# Patient Record
Sex: Female | Born: 1980
Health system: Southern US, Community
[De-identification: ages and names within clinical notes are randomized; demographics above are authoritative.]

## PROBLEM LIST (undated history)

## (undated) DIAGNOSIS — F329 Major depressive disorder, single episode, unspecified: Secondary | ICD-10-CM

## (undated) DIAGNOSIS — N809 Endometriosis, unspecified: Secondary | ICD-10-CM

## (undated) DIAGNOSIS — J189 Pneumonia, unspecified organism: Secondary | ICD-10-CM

## (undated) DIAGNOSIS — J45909 Unspecified asthma, uncomplicated: Secondary | ICD-10-CM

## (undated) DIAGNOSIS — N941 Unspecified dyspareunia: Secondary | ICD-10-CM

## (undated) DIAGNOSIS — R102 Pelvic and perineal pain: Secondary | ICD-10-CM

## (undated) DIAGNOSIS — F32A Depression, unspecified: Secondary | ICD-10-CM

## (undated) DIAGNOSIS — N946 Dysmenorrhea, unspecified: Secondary | ICD-10-CM

## (undated) HISTORY — PX: NO PAST SURGERIES: SHX2092

---

## 2015-10-05 DIAGNOSIS — J189 Pneumonia, unspecified organism: Secondary | ICD-10-CM

## 2015-10-05 HISTORY — DX: Pneumonia, unspecified organism: J18.9

## 2018-06-19 ENCOUNTER — Other Ambulatory Visit: Payer: Self-pay | Admitting: Obstetrics and Gynecology

## 2018-06-21 ENCOUNTER — Encounter (HOSPITAL_BASED_OUTPATIENT_CLINIC_OR_DEPARTMENT_OTHER): Payer: Self-pay | Admitting: *Deleted

## 2018-06-22 ENCOUNTER — Other Ambulatory Visit: Payer: Self-pay

## 2018-06-22 ENCOUNTER — Encounter (HOSPITAL_BASED_OUTPATIENT_CLINIC_OR_DEPARTMENT_OTHER): Payer: Self-pay | Admitting: *Deleted

## 2018-06-22 NOTE — Progress Notes (Addendum)
Spoke w/ pt via phone for pre-op interview.  Pt verbalized understanding npo after mn w/ exception clear liquid diet until 0715 and arrive at 0815.   Needs urine preg.  Pt lives in [place], [place], unable to come and pick up ensure pre-surgery drink.  ADDENDUM:  Received via fax from dr Leo Grosser office a notarized no blood products document, placed with chart.

## 2018-06-26 ENCOUNTER — Encounter (HOSPITAL_BASED_OUTPATIENT_CLINIC_OR_DEPARTMENT_OTHER): Payer: Self-pay | Admitting: Obstetrics and Gynecology

## 2018-06-26 DIAGNOSIS — Z531 Procedure and treatment not carried out because of patient's decision for reasons of belief and group pressure: Secondary | ICD-10-CM

## 2018-06-26 DIAGNOSIS — R102 Pelvic and perineal pain: Secondary | ICD-10-CM | POA: Diagnosis present

## 2018-06-26 DIAGNOSIS — D219 Benign neoplasm of connective and other soft tissue, unspecified: Secondary | ICD-10-CM | POA: Diagnosis present

## 2018-06-26 NOTE — H&P (Signed)
Rebecca Shields is an 37 y.o. female. Who presents for laparoscopic evaluation of pelvic pain, occurring primarily with mense, but also intrermenstrual  and dyspareunia.  Pertinent Gynecological History: Menses: regular every 28 days without intermenstrual spotting, lasting 5 days Bleeding: menses only Contraception: vasectomy DES exposure: unknown Blood transfusions: none .  Refuses blood transfusion on religious grounds Sexually transmitted diseases: no past history Previous GYN Procedures: none  Last mammogram: n/a Date:n/a Last pap: normal Date: 08/2017 OB History: G0, P0   Menstrual History: Menarche age: 26 Patient's last menstrual period was 05/30/2018 (exact date).    Past Medical History:  Diagnosis Date  . Female pelvic pain     Past Surgical History:  Procedure Laterality Date  . NO PAST SURGERIES      History reviewed. No pertinent family history.  Social History:  reports that she has never smoked. She has never used smokeless tobacco. She reports that she drinks alcohol. She reports that she does not use drugs.  Allergies: No Known Allergies  Medications Prior to Admission  Medication Sig Dispense Refill Last Dose  . ibuprofen (ADVIL,MOTRIN) 200 MG tablet Take 200 mg by mouth every 6 (six) hours as needed.   Past Week at Unknown time  . naproxen sodium (ALEVE) 220 MG tablet Take 220 mg by mouth.   Past Month at Unknown time    Review of Systems  Constitutional: Negative.   HENT: Negative.   Respiratory: Negative.   Cardiovascular: Negative.   Gastrointestinal: Positive for constipation.  Genitourinary:       Pelvic pain Dyspareunia  Musculoskeletal: Positive for back pain.       Has known bulging disc  Neurological: Negative.   Endo/Heme/Allergies: Negative.   Psychiatric/Behavioral: The patient is nervous/anxious.     Blood pressure 127/84, pulse 70, temperature 98.6 F (37 C), temperature source Oral, resp. rate 16, height 5\' 1"  (1.549 m),  weight 154 lb 8 oz (70.1 kg), last menstrual period 05/30/2018, SpO2 100 %. Physical Exam  Constitutional: She is oriented to person, place, and time. She appears well-developed and well-nourished.  HENT:  Head: Normocephalic and atraumatic.  Eyes: EOM are normal.  Neck: Normal range of motion. Neck supple.  Cardiovascular: Normal rate and regular rhythm.  Respiratory: Effort normal and breath sounds normal.  GI: Soft. Bowel sounds are normal. She exhibits mass.  Suprapubic nontender  Genitourinary:  Genitourinary Comments: Pelvic exam:  VULVA: normal appearing vulva with no masses, tenderness or lesions,  VAGINA: normal appearing vagina with normal color and discharge, no lesions,  CERVIX: normal appearing cervix without discharge or lesions,  UTERUS: uterus is normal size, shape, consistency and nontender, enlarged to 12 week's size,  ADNEXA: normal adnexa in size, nontender and no masses, no masses.  Musculoskeletal: Normal range of motion.  Neurological: She is alert and oriented to person, place, and time.  Skin: Skin is warm and dry.  Psychiatric: She has a normal mood and affect.  anxious    Results for orders placed or performed during the hospital encounter of 06/27/18 (from the past 24 hour(s))  Pregnancy, urine POC     Status: None   Collection Time: 06/27/18  8:38 AM  Result Value Ref Range   Preg Test, Ur NEGATIVE NEGATIVE    Assessment/Plan: Pelvic pain both with menses and between menses Dyspareunia, insertional and deep Fibroids  Plan: Pt was given the options of conservative management with continuous bcps, Mirena IUD or Laparoscopy for more evaluation with resection of endometriosis if possible.Marland Kitchen  Pt chooses the latter.  Reviewed the risks of anesthesia, bleeding, infection and damage to adjacent organs associated with laparoscopy.  She wishes to proceed.  Rebecca Shields 06/27/2018, 9:14 AM

## 2018-06-27 ENCOUNTER — Ambulatory Visit (HOSPITAL_BASED_OUTPATIENT_CLINIC_OR_DEPARTMENT_OTHER): Payer: BLUE CROSS/BLUE SHIELD | Admitting: Anesthesiology

## 2018-06-27 ENCOUNTER — Encounter (HOSPITAL_BASED_OUTPATIENT_CLINIC_OR_DEPARTMENT_OTHER)
Admission: RE | Disposition: A | Discharge status: Home / Self Care | Payer: Self-pay | Source: Ambulatory Visit | Attending: Obstetrics and Gynecology

## 2018-06-27 ENCOUNTER — Encounter (HOSPITAL_BASED_OUTPATIENT_CLINIC_OR_DEPARTMENT_OTHER): Payer: Self-pay | Admitting: *Deleted

## 2018-06-27 ENCOUNTER — Ambulatory Visit (HOSPITAL_BASED_OUTPATIENT_CLINIC_OR_DEPARTMENT_OTHER)
Admission: RE | Admit: 2018-06-27 | Discharge: 2018-06-27 | Disposition: A | Discharge status: Home / Self Care | Payer: BLUE CROSS/BLUE SHIELD | Source: Ambulatory Visit | Attending: Obstetrics and Gynecology | Admitting: Obstetrics and Gynecology

## 2018-06-27 DIAGNOSIS — N83201 Unspecified ovarian cyst, right side: Secondary | ICD-10-CM | POA: Diagnosis not present

## 2018-06-27 DIAGNOSIS — R102 Pelvic and perineal pain unspecified side: Secondary | ICD-10-CM | POA: Diagnosis present

## 2018-06-27 DIAGNOSIS — Z531 Procedure and treatment not carried out because of patient's decision for reasons of belief and group pressure: Secondary | ICD-10-CM

## 2018-06-27 DIAGNOSIS — N736 Female pelvic peritoneal adhesions (postinfective): Secondary | ICD-10-CM | POA: Diagnosis not present

## 2018-06-27 DIAGNOSIS — N941 Unspecified dyspareunia: Secondary | ICD-10-CM | POA: Insufficient documentation

## 2018-06-27 DIAGNOSIS — N946 Dysmenorrhea, unspecified: Secondary | ICD-10-CM | POA: Insufficient documentation

## 2018-06-27 DIAGNOSIS — D219 Benign neoplasm of connective and other soft tissue, unspecified: Secondary | ICD-10-CM | POA: Diagnosis present

## 2018-06-27 HISTORY — PX: LAPAROSCOPIC LYSIS OF ADHESIONS: SHX5905

## 2018-06-27 HISTORY — DX: Pelvic and perineal pain: R10.2

## 2018-06-27 LAB — POCT PREGNANCY, URINE: PREG TEST UR: NEGATIVE

## 2018-06-27 SURGERY — LYSIS, ADHESIONS, LAPAROSCOPIC
Anesthesia: General

## 2018-06-27 MED ORDER — DEXAMETHASONE SODIUM PHOSPHATE 10 MG/ML IJ SOLN
INTRAMUSCULAR | Status: AC
  Filled 2018-06-27: qty 1

## 2018-06-27 MED ORDER — ONDANSETRON HCL 4 MG/2ML IJ SOLN
INTRAMUSCULAR | Status: AC
  Filled 2018-06-27: qty 2

## 2018-06-27 MED ORDER — KETOROLAC TROMETHAMINE 15 MG/ML IJ SOLN
15.0000 mg | INTRAMUSCULAR | Status: AC
  Administered 2018-06-27 (×2): 15 mg via INTRAVENOUS
  Filled 2018-06-27: qty 1

## 2018-06-27 MED ORDER — ROCURONIUM BROMIDE 10 MG/ML (PF) SYRINGE
PREFILLED_SYRINGE | INTRAVENOUS | Status: DC | PRN
  Administered 2018-06-27 (×2): 10 mg via INTRAVENOUS
  Administered 2018-06-27: 50 mg via INTRAVENOUS

## 2018-06-27 MED ORDER — MEPERIDINE HCL 25 MG/ML IJ SOLN
6.2500 mg | INTRAMUSCULAR | Status: DC | PRN
  Filled 2018-06-27: qty 1

## 2018-06-27 MED ORDER — OXYCODONE HCL 5 MG PO TABS
5.0000 mg | ORAL_TABLET | Freq: Once | ORAL | Status: DC | PRN
  Filled 2018-06-27: qty 1

## 2018-06-27 MED ORDER — ONDANSETRON 8 MG PO TBDP
ORAL_TABLET | ORAL | Status: AC
  Filled 2018-06-27: qty 1

## 2018-06-27 MED ORDER — MIDAZOLAM HCL 2 MG/2ML IJ SOLN
INTRAMUSCULAR | Status: DC | PRN
  Administered 2018-06-27: 2 mg via INTRAVENOUS

## 2018-06-27 MED ORDER — LIDOCAINE 2% (20 MG/ML) 5 ML SYRINGE
INTRAMUSCULAR | Status: AC
  Filled 2018-06-27: qty 5

## 2018-06-27 MED ORDER — BUPIVACAINE LIPOSOME 1.3 % IJ SUSP
INTRAMUSCULAR | Status: DC | PRN
  Administered 2018-06-27: 20 mL

## 2018-06-27 MED ORDER — KETOROLAC TROMETHAMINE 30 MG/ML IJ SOLN
INTRAMUSCULAR | Status: DC | PRN
  Administered 2018-06-27: 30 mg via INTRAMUSCULAR

## 2018-06-27 MED ORDER — ONDANSETRON HCL 4 MG/2ML IJ SOLN
INTRAMUSCULAR | Status: DC | PRN
  Administered 2018-06-27: 4 mg via INTRAVENOUS

## 2018-06-27 MED ORDER — SCOPOLAMINE 1 MG/3DAYS TD PT72
1.0000 | MEDICATED_PATCH | TRANSDERMAL | Status: DC
  Administered 2018-06-27: 1.5 mg via TRANSDERMAL
  Filled 2018-06-27: qty 1

## 2018-06-27 MED ORDER — KETOROLAC TROMETHAMINE 30 MG/ML IJ SOLN
INTRAMUSCULAR | Status: AC
  Filled 2018-06-27: qty 1

## 2018-06-27 MED ORDER — PROMETHAZINE HCL 25 MG/ML IJ SOLN
6.2500 mg | INTRAMUSCULAR | Status: DC | PRN
  Filled 2018-06-27: qty 1

## 2018-06-27 MED ORDER — FENTANYL CITRATE (PF) 250 MCG/5ML IJ SOLN
INTRAMUSCULAR | Status: AC
  Filled 2018-06-27: qty 5

## 2018-06-27 MED ORDER — MIDAZOLAM HCL 2 MG/2ML IJ SOLN
INTRAMUSCULAR | Status: AC
  Filled 2018-06-27: qty 2

## 2018-06-27 MED ORDER — LACTATED RINGERS IV SOLN
INTRAVENOUS | Status: DC
  Administered 2018-06-27 (×2): via INTRAVENOUS
  Filled 2018-06-27: qty 1000

## 2018-06-27 MED ORDER — PROPOFOL 10 MG/ML IV BOLUS
INTRAVENOUS | Status: AC
  Filled 2018-06-27: qty 20

## 2018-06-27 MED ORDER — HYDROMORPHONE HCL 1 MG/ML IJ SOLN
INTRAMUSCULAR | Status: AC
  Filled 2018-06-27: qty 1

## 2018-06-27 MED ORDER — LIDOCAINE 2% (20 MG/ML) 5 ML SYRINGE
INTRAMUSCULAR | Status: DC | PRN
  Administered 2018-06-27: 40 mg via INTRAVENOUS

## 2018-06-27 MED ORDER — HYDROCODONE-ACETAMINOPHEN 5-325 MG PO TABS: 1.0000 | ORAL_TABLET | Freq: Four times a day (QID) | ORAL | 0 refills | Status: DC | PRN

## 2018-06-27 MED ORDER — ACETAMINOPHEN 500 MG PO TABS
1000.0000 mg | ORAL_TABLET | ORAL | Status: AC
  Administered 2018-06-27: 1000 mg via ORAL
  Filled 2018-06-27: qty 2

## 2018-06-27 MED ORDER — IBUPROFEN 600 MG PO TABS: ORAL_TABLET | ORAL | 0 refills | Status: DC

## 2018-06-27 MED ORDER — LACTATED RINGERS IV SOLN
INTRAVENOUS | Status: DC
  Filled 2018-06-27: qty 1000

## 2018-06-27 MED ORDER — KETOROLAC TROMETHAMINE 30 MG/ML IJ SOLN
INTRAMUSCULAR | Status: AC
  Filled 2018-06-27: qty 3

## 2018-06-27 MED ORDER — PROPOFOL 10 MG/ML IV BOLUS
INTRAVENOUS | Status: DC | PRN
  Administered 2018-06-27: 70 mg via INTRAVENOUS
  Administered 2018-06-27: 130 mg via INTRAVENOUS

## 2018-06-27 MED ORDER — SCOPOLAMINE 1 MG/3DAYS TD PT72
MEDICATED_PATCH | TRANSDERMAL | Status: AC
  Filled 2018-06-27: qty 1

## 2018-06-27 MED ORDER — SUGAMMADEX SODIUM 200 MG/2ML IV SOLN
INTRAVENOUS | Status: AC
  Filled 2018-06-27: qty 2

## 2018-06-27 MED ORDER — FENTANYL CITRATE (PF) 100 MCG/2ML IJ SOLN
INTRAMUSCULAR | Status: DC | PRN
  Administered 2018-06-27 (×3): 50 ug via INTRAVENOUS
  Administered 2018-06-27: 100 ug via INTRAVENOUS

## 2018-06-27 MED ORDER — ONDANSETRON 8 MG PO TBDP
8.0000 mg | ORAL_TABLET | Freq: Once | ORAL | Status: AC
  Administered 2018-06-27: 8 mg via ORAL
  Filled 2018-06-27: qty 1

## 2018-06-27 MED ORDER — HYDROMORPHONE HCL 1 MG/ML IJ SOLN
0.2500 mg | INTRAMUSCULAR | Status: DC | PRN
  Administered 2018-06-27: 0.5 mg via INTRAVENOUS
  Filled 2018-06-27: qty 0.5

## 2018-06-27 MED ORDER — BUPIVACAINE HCL (PF) 0.5 % IJ SOLN
INTRAMUSCULAR | Status: DC | PRN
  Administered 2018-06-27: 30 mL

## 2018-06-27 MED ORDER — DEXAMETHASONE SODIUM PHOSPHATE 10 MG/ML IJ SOLN
INTRAMUSCULAR | Status: DC | PRN
  Administered 2018-06-27: 10 mg via INTRAVENOUS

## 2018-06-27 MED ORDER — OXYCODONE HCL 5 MG/5ML PO SOLN
5.0000 mg | Freq: Once | ORAL | Status: DC | PRN
  Filled 2018-06-27: qty 5

## 2018-06-27 MED ORDER — ACETAMINOPHEN 500 MG PO TABS
ORAL_TABLET | ORAL | Status: AC
  Filled 2018-06-27: qty 2

## 2018-06-27 MED ORDER — SUGAMMADEX SODIUM 200 MG/2ML IV SOLN
INTRAVENOUS | Status: DC | PRN
  Administered 2018-06-27: 200 mg via INTRAVENOUS

## 2018-06-27 MED ORDER — ROCURONIUM BROMIDE 10 MG/ML (PF) SYRINGE
PREFILLED_SYRINGE | INTRAVENOUS | Status: AC
  Filled 2018-06-27: qty 10

## 2018-06-27 MED FILL — IBUPROFEN 600 MG TABLET: 600 | 15 days supply | Qty: 60 | Fill #0

## 2018-06-27 MED FILL — HYDROCODON-APAP 5-325: 5-325 | 2 days supply | Qty: 20 | Fill #0

## 2018-06-27 SURGICAL SUPPLY — 51 items
BAG RETRIEVAL 10MM (BASKET)
BAG URINE DRAINAGE (UROLOGICAL SUPPLIES) ×3 IMPLANT
CABLE HIGH FREQUENCY MONO STRZ (ELECTRODE) ×3 IMPLANT
CATH ROBINSON RED A/P 16FR (CATHETERS) IMPLANT
CLOSURE WOUND 1/4 X3 (GAUZE/BANDAGES/DRESSINGS)
DERMABOND ADVANCED (GAUZE/BANDAGES/DRESSINGS) ×2
DERMABOND ADVANCED .7 DNX12 (GAUZE/BANDAGES/DRESSINGS) ×1 IMPLANT
DRSG OPSITE POSTOP 3X4 (GAUZE/BANDAGES/DRESSINGS) ×3 IMPLANT
DRSG TELFA 3X8 NADH (GAUZE/BANDAGES/DRESSINGS) IMPLANT
DURAPREP 26ML APPLICATOR (WOUND CARE) ×6 IMPLANT
GAUZE SPONGE 4X4 16PLY XRAY LF (GAUZE/BANDAGES/DRESSINGS) ×3 IMPLANT
GLOVE BIOGEL PI IND STRL 7.0 (GLOVE) ×3 IMPLANT
GLOVE BIOGEL PI INDICATOR 7.0 (GLOVE) ×6
GLOVE SURG SS PI 6.5 STRL IVOR (GLOVE) ×6 IMPLANT
GOWN STRL REUS W/TWL LRG LVL3 (GOWN DISPOSABLE) ×3 IMPLANT
KIT TURNOVER CYSTO (KITS) ×3 IMPLANT
NEEDLE HYPO 25X1 1.5 SAFETY (NEEDLE) ×6 IMPLANT
NEEDLE INSUFFLATION 120MM (ENDOMECHANICALS) ×3 IMPLANT
NEEDLE INSUFFLATION 14GA 150MM (NEEDLE) IMPLANT
NS IRRIG 1000ML POUR BTL (IV SOLUTION) ×3 IMPLANT
PACK LAPAROSCOPY BASIN (CUSTOM PROCEDURE TRAY) ×3 IMPLANT
PACK TRENDGUARD 450 HYBRID PRO (MISCELLANEOUS) ×1 IMPLANT
PAD POSITIONING PINK XL (MISCELLANEOUS) IMPLANT
PAD PREP 24X48 CUFFED NSTRL (MISCELLANEOUS) ×3 IMPLANT
PROTECTOR NERVE ULNAR (MISCELLANEOUS) ×3 IMPLANT
SET IRRIG TUBING LAPAROSCOPIC (IRRIGATION / IRRIGATOR) IMPLANT
SET TRI-LUMEN FLTR TB AIRSEAL (TUBING) IMPLANT
SHEARS HARMONIC ACE PLUS 36CM (ENDOMECHANICALS) IMPLANT
SHEARS HARMONIC ACE PLUS 45CM (MISCELLANEOUS) IMPLANT
STRIP CLOSURE SKIN 1/4X3 (GAUZE/BANDAGES/DRESSINGS) IMPLANT
SUT MNCRL AB 3-0 PS2 27 (SUTURE) ×3 IMPLANT
SUT MNCRL AB 4-0 PS2 18 (SUTURE) IMPLANT
SUT VIC AB 0 UR5 27 (SUTURE) IMPLANT
SUT VIC AB 3-0 PS2 18 (SUTURE)
SUT VIC AB 3-0 PS2 18XBRD (SUTURE) IMPLANT
SUT VICRYL 0 ENDOLOOP (SUTURE) IMPLANT
SUT VICRYL 0 UR6 27IN ABS (SUTURE) ×3 IMPLANT
SYR 10ML LL (SYRINGE) ×3 IMPLANT
SYR 50ML LL SCALE MARK (SYRINGE) ×3 IMPLANT
SYS BAG RETRIEVAL 10MM (BASKET)
SYSTEM BAG RETRIEVAL 10MM (BASKET) IMPLANT
TOWEL OR 17X24 6PK STRL BLUE (TOWEL DISPOSABLE) ×6 IMPLANT
TRAY FOLEY W/BAG SLVR 14FR (SET/KITS/TRAYS/PACK) ×3 IMPLANT
TRENDGUARD 450 HYBRID PRO PACK (MISCELLANEOUS) ×3
TROCAR BALLN 12MMX100 BLUNT (TROCAR) ×3 IMPLANT
TROCAR OPTI TIP 5M 100M (ENDOMECHANICALS) ×3 IMPLANT
TROCAR PORT AIRSEAL 5X120 (TROCAR) IMPLANT
TROCAR XCEL DIL TIP R 11M (ENDOMECHANICALS) ×3 IMPLANT
TUBING INSUF HEATED (TUBING) ×3 IMPLANT
WARMER LAPAROSCOPE (MISCELLANEOUS) ×3 IMPLANT
WATER STERILE IRR 500ML POUR (IV SOLUTION) IMPLANT

## 2018-06-27 NOTE — Discharge Instructions (Signed)
DISCHARGE INSTRUCTIONS TO PATIENT  Medications and dosages: see AVS  REMINDER:   Carry a list of your medications and allergies with you at all times  Call your pharmacy at least 1 week in advance to refill prescriptions  Do not mix any prescribed pain medicine with alcohol  Do not drive any motor vehicles while taking pain medication.  Take medications with food.  Do not retake a pain medication if you vomit after taking it.  Activity: as tolerated.  No driving for 24 hours   Follow-up appointments (date to return to physician):   Call Surgeon if you have:  Temperature greater than 100.4  Persistent nausea and vomiting  Severe uncontrolled pain  Redness, tenderness, or signs of infection (pain, swelling, redness, odor or green/yellow discharge around the site)  Difficulty breathing, headache or visual disturbances  Hives  Persistent dizziness or light-headedness  Extreme fatigue  Any other questions or concerns you may have after discharge  In an emergency, call 911 or go to an Emergency Department at a nearby hospital     Diet:   Begin with liquids, and if they are tolerated, resume your usual diet.  Avoid spicy, greasy or heavy foods.  If you have nausea or vomiting, go back to liquids.  If you cannot keep liquids down, call your doctor.  Avoid alcohol consumption while on prescription pain medications. Good nutrition promotes healing. Increase fiber and fluids.     Diagnostic Laparoscopy, Care After Refer to this sheet in the next few weeks. These instructions provide you with information about caring for yourself after your procedure. Your health care provider may also give you more specific instructions. Your treatment has been planned according to current medical practices, but problems sometimes occur. Call your health care provider if you have any problems or questions after your procedure. What can I expect after the procedure? After your procedure,  it is common to have mild discomfort in the throat and abdomen. Follow these instructions at home:  Take over-the-counter and prescription medicines only as told by your health care provider.  Do not drive for 24 hours if you received a sedative.  Return to your normal activities as told by your health care provider.  Do not take baths, swim, or use a hot tub until your health care provider approves. You may shower.  Follow instructions from your health care provider about how to take care of your incision. Make sure you: ? Wash your hands with soap and water before you change your bandage (dressing). If soap and water are not available, use hand sanitizer. ? Change your dressing as told by your health care provider. ? Leave stitches (sutures), skin glue, or adhesive strips in place. These skin closures may need to stay in place for 2 weeks or longer. If adhesive strip edges start to loosen and curl up, you may trim the loose edges. Do not remove adhesive strips completely unless your health care provider tells you to do that.  Check your incision area every day for signs of infection. Check for: ? More redness, swelling, or pain. ? More fluid or blood. ? Warmth. ? Pus or a bad smell.  It is your responsibility to get the results of your procedure. Ask your health care provider or the department performing the procedure when your results will be ready. Contact a health care provider if:  There is new pain in your shoulders.  You feel light-headed or faint.  You are unable to pass gas  or unable to have a bowel movement.  You feel nauseous or you vomit.  You develop a rash.  You have more redness, swelling, or pain around your incision.  You have more fluid or blood coming from your incision.  Your incision feels warm to the touch.  You have pus or a bad smell coming from your incision.  You have a fever or chills. Get help right away if:  Your pain is getting  worse.  You have ongoing vomiting.  The edges of your incision open up.  You have trouble breathing.  You have chest pain. This information is not intended to replace advice given to you by your health care provider. Make sure you discuss any questions you have with your health care provider. Document Released: 09/01/2015 Document Revised: 02/26/2016 Document Reviewed: 06/03/2015 Elsevier Interactive Patient Education  [employer].  I understand and acknowledge receipt of the above instructions.                                                                                                                                        Patient or Guardian Signature                                                                    Date/Time                                                                                                                                              Post Anesthesia Home Care Instructions  Activity: Get plenty of rest for the remainder of the day. A responsible individual must stay with you for 24 hours following the procedure.  For the next 24 hours, DO NOT: -Drive a car -Paediatric nurse -Drink alcoholic beverages -Take any medication unless instructed by your physician -Make any legal decisions or sign important papers.  Meals: Start with liquid foods such as gelatin or soup. Progress to regular foods as tolerated. Avoid greasy, spicy, heavy foods. If nausea and/or vomiting occur, drink only clear liquids  until the nausea and/or vomiting subsides. Call your physician if vomiting continues.  Special Instructions/Symptoms: Your throat may feel dry or sore from the anesthesia or the breathing tube placed in your throat during surgery. If this causes discomfort, gargle with warm salt water. The discomfort should disappear within 24 hours.  If you had a scopolamine patch placed behind your ear for the management of post- operative nausea and/or  vomiting:  1. The medication in the patch is effective for 72 hours, after which it should be removed.  Wrap patch in a tissue and discard in the trash. Wash hands thoroughly with soap and water. 2. You may remove the patch earlier than 72 hours if you experience unpleasant side effects which may include dry mouth, dizziness or visual disturbances. 3. Avoid touching the patch. Wash your hands with soap and water after contact with the patch.    Physician's or R.N.'s Signature                                                                  Date/Time  The discharge instructions have been reviewed with the patient and/or Family Member/Parent/Guardian.  Patient and/or Family Member/Parent/Guardian signed and retained a printed copy.    NO IBUPROFEN PRODUCTS (MOTRIN,ADVIL) OR ALEVE UNTIL 5:25PM TODAY.

## 2018-06-27 NOTE — Op Note (Signed)
Operative Laparoscopy Procedure Note  Indications: The patient is a 37 y.o. female with pelvic pain dysmenorrhea and dyspareunia.  Pre-operative Diagnosis: Pelvic pain dysmenorrhea and dyspareunia  Post-operative Diagnosis: Pelvic pain dysmenorrhea and dyspareunia.  Endometriosis and pelvic adhesions  Surgeon: Eldred Manges   Assistants: None  Anesthesia: General endotracheal anesthesia  ASA Class: 2  Procedure Details  The patient was seen in the Holding Room. The risks, benefits, complications, treatment options, and expected outcomes were discussed with the patient.  Her desire to receive no blood products and no intravenous infusions other than those indicated on her completed alternatives to blood transfusion sheet.  The patient concurred with the proposed plan, giving informed consent. The patient was taken to the Operating Room, identified as Rebecca Shields and the procedure verified as operative laparoscopy. A Time Out was held and the above information confirmed.  After induction of general anesthesia, the patient was placed in modified dorsal lithotomy position where she was prepped, draped,in the normal, sterile fashion.  An indwelling Foley catheter was placed under sterile conditions and connected to straight drainage.  The cervix was visualized and an intrauterine manipulator was placed. Subumbilical and suprapubic injection of half percent Marcaine was undertaken for a total of 6 cc.  A 10 mm umbilical incision was then performed.  The fascia was exposed with combination of blunt and sharp dissection and elevated then incised.  Peritoneum was bluntly entered.  The peritoneum and fascia were marked on either side of the incision and the Hassan cannula was passed into the peritoneum under direct visualization, stabilized with the marking sutures of 0 Vicryl, and pneumoperitoneum was established. The above findings were noted.  Suprapubic incisions were made to the right and  left of midline, a 5 mm incision on the left and a 10 mm incision on the right.  Arthroscopic probe trochars of each size were placed into the peritoneal cavity under direct visualization.  The ovaries that were bilaterally adherent to the posterior uterus at the level of the cervix were mobilized with a combination of blunt and sharp dissection.  The right ovary contained a cyst which ruptured on manipulation and was subsequently excised sharply with the cut edges made hemostatic with bipolar cautery.  The densely adherent sigmoid colon was not mobilized because of concern for damage to the active visceral organ.  Copious irrigation was carried out and hemostasis noted to be adequate.  20 cc of Exparel and 10 cc of half percent Marcaine mixture were placed in the posterior pelvis at the site of the dissection.  Following the procedure the suprapubic scars were removed under direct visualization.  The Hassan cannula in the umbilical sheath was removed and the intra-abdominal carbon dioxide was expressed.  The fascial incision was closed with figure-of-eight sutures of 0 Vicryl.  The skin the incision was closed with subcuticular sutures of 3-0 Monocryl.  The right suprapubic incision was closed with actual sutures of 0 Vicryl and Dermabond.  The left suprapubic incision was closed with Dermabond.  Sterile dressings were applied to the incisions of the 10 mm or greater.  The intrauterine manipulator was then removed.  Instrument, sponge, and needle counts were correct prior to abdominal closure and at the conclusion of the case.   Findings: The anterior cul-de-sac and round ligaments no adhesions or stigmata of endometriosis. The uterus minimally enlarged without irregularity.  The posterior uterus was densely adherent to the sigmoid colon in the midline and the right and left ovaries posteriorly on  either side. The adnexa the tubes were normal-appearing with fairly fine fimbria.  The ovaries bilaterally  were adherent to the posterior uterus with a cyst on the right ovary.  The initial release of the right ovary a like material was released.  Other than the adhesive disease no other stigmata of endometriosis was noted. Cul-de-sac posteriorly the was involved with the sigmoid to posterior uterine adhesions at the level of the uterosacral ligament. Appendix: Visualized in the right abdomen without significant adhesions and freely mobile.  There were no stigmata of endometriosis.  Estimated Blood Loss:  less than 50 mL         Drains: None         Specimens: Ovarian cyst rule out endometriosis              Complications:  None; patient tolerated the procedure well.         Disposition: PACU - hemodynamically stable.         Condition: stable

## 2018-06-27 NOTE — Transfer of Care (Signed)
Immediate Anesthesia Transfer of Care Note  Patient: Rebecca Shields  Procedure(s) Performed: LAPAROSCOPIC LYSIS OF ADHESIONS,RIGHT OVARIAN CYSTECTOMY (N/A )  Patient Location: PACU  Anesthesia Type:General  Level of Consciousness: awake, alert , oriented and patient cooperative  Airway & Oxygen Therapy: Patient Spontanous Breathing and Patient connected to face mask oxygen  Post-op Assessment: Report given to RN and Post -op Vital signs reviewed and stable  Post vital signs: Reviewed and stable  Last Vitals:  Vitals Value Taken Time  BP    Temp    Pulse    Resp    SpO2      Last Pain:  Vitals:   06/27/18 0817  TempSrc: Oral         Complications: No apparent anesthesia complications

## 2018-06-27 NOTE — Anesthesia Preprocedure Evaluation (Addendum)
Anesthesia Evaluation  Patient identified by MRN, date of birth, ID band Patient awake    Reviewed: Allergy & Precautions, NPO status , Patient's Chart, lab work & pertinent test results  Airway Mallampati: I  TM Distance: >3 FB Neck ROM: Full    Dental  (+) Teeth Intact, Dental Advisory Given   Pulmonary neg pulmonary ROS,    breath sounds clear to auscultation       Cardiovascular negative cardio ROS   Rhythm:Regular Rate:Normal     Neuro/Psych negative neurological ROS     GI/Hepatic negative GI ROS, Neg liver ROS,   Endo/Other  negative endocrine ROS  Renal/GU negative Renal ROS     Musculoskeletal negative musculoskeletal ROS (+)   Abdominal Normal abdominal exam  (+)   Peds  Hematology negative hematology ROS (+)   Anesthesia Other Findings   Reproductive/Obstetrics                           Anesthesia Physical Anesthesia Plan  ASA: I  Anesthesia Plan: General   Post-op Pain Management:    Induction: Intravenous  PONV Risk Score and Plan: 4 or greater and Ondansetron, Dexamethasone, Midazolam and Scopolamine patch - Pre-op  Airway Management Planned: Oral ETT  Additional Equipment: None  Intra-op Plan:   Post-operative Plan: Extubation in OR  Informed Consent: I have reviewed the patients History and Physical, chart, labs and discussed the procedure including the risks, benefits and alternatives for the proposed anesthesia with the patient or authorized representative who has indicated his/her understanding and acceptance.   Dental advisory given  Plan Discussed with: CRNA  Anesthesia Plan Comments:        Anesthesia Quick Evaluation

## 2018-06-27 NOTE — Anesthesia Procedure Notes (Addendum)
Procedure Name: Intubation Date/Time: 06/27/2018 10:14 AM Performed by: Wanita Chamberlain, CRNA Pre-anesthesia Checklist: Patient being monitored, Suction available, Emergency Drugs available, Patient identified and Timeout performed Patient Re-evaluated:Patient Re-evaluated prior to induction Oxygen Delivery Method: Circle system utilized Preoxygenation: Pre-oxygenation with 100% oxygen Induction Type: IV induction Ventilation: Mask ventilation without difficulty Laryngoscope Size: Mac and 3 Grade View: Grade II Tube type: Oral Tube size: 7.0 mm Number of attempts: 1 Airway Equipment and Method: Stylet Placement Confirmation: CO2 detector,  positive ETCO2,  ETT inserted through vocal cords under direct vision and breath sounds checked- equal and bilateral Secured at: 21 cm Tube secured with: Tape Dental Injury: Teeth and Oropharynx as per pre-operative assessment  Comments: Grade II view due to trenguard head positioner limiting neck extension

## 2018-06-28 ENCOUNTER — Encounter (HOSPITAL_BASED_OUTPATIENT_CLINIC_OR_DEPARTMENT_OTHER): Payer: Self-pay | Admitting: Obstetrics and Gynecology

## 2018-06-28 NOTE — Anesthesia Postprocedure Evaluation (Signed)
Anesthesia Post Note  Patient: Carlen A Romberger  Procedure(s) Performed: LAPAROSCOPIC LYSIS OF ADHESIONS,RIGHT OVARIAN CYSTECTOMY (N/A )     Patient location during evaluation: PACU Anesthesia Type: General Level of consciousness: awake and alert Pain management: pain level controlled Vital Signs Assessment: post-procedure vital signs reviewed and stable Respiratory status: spontaneous breathing, nonlabored ventilation, respiratory function stable and patient connected to nasal cannula oxygen Cardiovascular status: blood pressure returned to baseline and stable Postop Assessment: no apparent nausea or vomiting Anesthetic complications: no    Last Vitals:  Vitals:   06/27/18 1400 06/27/18 1526  BP: 124/84 104/73  Pulse: 66 79  Resp: 16 17  Temp: 36.6 C 36.4 C  SpO2: 99% 96%    Last Pain:  Vitals:   06/27/18 1526  TempSrc: Oral  PainSc:                  Effie Berkshire

## 2018-09-20 ENCOUNTER — Other Ambulatory Visit: Payer: Self-pay | Admitting: Obstetrics and Gynecology

## 2018-09-20 ENCOUNTER — Encounter (HOSPITAL_COMMUNITY): Payer: Self-pay

## 2018-09-20 NOTE — Patient Instructions (Addendum)
Your procedure is scheduled on: Monday, Dec. 23, 2019   Surgery Time:  7:15AM-10:15AM   Report to [place]  Entrance    Report to admitting at 5:30 AM   Call this number if you have problems the morning of surgery [phone]   Do not eat food:After Midnight.   May have liquids until 4:30AM day of surgery CLEAR LIQUID DIET  Foods Allowed                                                                     Foods Excluded  Water, Black Coffee and tea, regular and decaf                             liquids that you cannot  Plain Jell-O in any flavor                                             see through such as: Fruit ices (not with fruit pulp)                                     milk, soups, orange juice  Iced Popsicles                                    All solid food Carbonated beverages, regular and diet                                    Cranberry, grape and apple juices Sports drinks like Gatorade Lightly seasoned clear broth or consume(fat free) Sugar, honey syrup  Sample Menu Breakfast                                Lunch                                     Supper Cranberry juice                    Beef broth                            Chicken broth Jell-O                                     Grape juice                           Apple juice Coffee or tea  Jell-O                                      Popsicle                                                Coffee or tea                        Coffee or tea    Brush your teeth the morning of surgery.   Do NOT smoke after Midnight   Complete one Ensure drink the morning of surgery at 4:30AM the day of surgery.   Take these medicines the morning of surgery with A SIP OF WATER:  None                               You may not have any metal on your body including hair pins, jewelry, and body piercings             Do not wear make-up, lotions, powders, perfumes/cologne, or  deodorant             Do not wear nail polish.  Do not shave  48 hours prior to surgery.              Do not bring valuables to the hospital. [place].   Contacts, dentures or bridgework may not be worn into surgery.   Leave suitcase in the car. After surgery it may be brought to your room.    Special Instructions: Bring a copy of your healthcare power of attorney and living will documents         the day of surgery if you haven't scanned them in before.              Please read over the following fact sheets you were given:  [facility] - Preparing for Surgery Before surgery, you can play an important role.  Because skin is not sterile, your skin needs to be as free of germs as possible.  You can reduce the number of germs on your skin by washing with CHG (chlorahexidine gluconate) soap before surgery.  CHG is an antiseptic cleaner which kills germs and bonds with the skin to continue killing germs even after washing. Please DO NOT use if you have an allergy to CHG or antibacterial soaps.  If your skin becomes reddened/irritated stop using the CHG and inform your nurse when you arrive at Short Stay. Do not shave (including legs and underarms) for at least 48 hours prior to the first CHG shower.  You may shave your face/neck.  Please follow these instructions carefully:  1.  Shower with CHG Soap the night before surgery and the  morning of surgery.  2.  If you choose to wash your hair, wash your hair first as usual with your normal  shampoo.  3.  After you shampoo, rinse your hair and body thoroughly to remove the shampoo.  4.  Use CHG as you would any other liquid soap.  You can apply chg directly to the skin and wash.  Gently with a scrungie or clean washcloth.  5.  Apply the CHG Soap to your body ONLY FROM THE NECK DOWN.   Do   not use on face/ open                           Wound or open sores. Avoid contact  with eyes, ears mouth and   genitals (private parts).                       Wash face,  Genitals (private parts) with your normal soap.             6.  Wash thoroughly, paying special attention to the area where your    surgery  will be performed.  7.  Thoroughly rinse your body with warm water from the neck down.  8.  DO NOT shower/wash with your normal soap after using and rinsing off the CHG Soap.                9.  Pat yourself dry with a clean towel.            10.  Wear clean pajamas.            11.  Place clean sheets on your bed the night of your first shower and do not  sleep with pets. Day of Surgery : Do not apply any lotions/deodorants the morning of surgery.  Please wear clean clothes to the hospital/surgery center.  FAILURE TO FOLLOW THESE INSTRUCTIONS MAY RESULT IN THE CANCELLATION OF YOUR SURGERY  PATIENT SIGNATURE_________________________________  NURSE SIGNATURE__________________________________  ________________________________________________________________________

## 2018-09-22 ENCOUNTER — Encounter (HOSPITAL_COMMUNITY): Payer: Self-pay

## 2018-09-22 ENCOUNTER — Other Ambulatory Visit: Payer: Self-pay

## 2018-09-22 ENCOUNTER — Encounter (HOSPITAL_COMMUNITY)
Admission: RE | Admit: 2018-09-22 | Discharge: 2018-09-22 | Disposition: A | Discharge status: Home / Self Care | Payer: BLUE CROSS/BLUE SHIELD | Source: Ambulatory Visit | Attending: Obstetrics and Gynecology | Admitting: Obstetrics and Gynecology

## 2018-09-22 DIAGNOSIS — Z01812 Encounter for preprocedural laboratory examination: Secondary | ICD-10-CM | POA: Insufficient documentation

## 2018-09-22 HISTORY — DX: Pneumonia, unspecified organism: J18.9

## 2018-09-22 HISTORY — DX: Major depressive disorder, single episode, unspecified: F32.9

## 2018-09-22 HISTORY — DX: Depression, unspecified: F32.A

## 2018-09-22 HISTORY — DX: Dysmenorrhea, unspecified: N94.6

## 2018-09-22 HISTORY — DX: Unspecified dyspareunia: N94.10

## 2018-09-22 HISTORY — DX: Endometriosis, unspecified: N80.9

## 2018-09-22 HISTORY — DX: Unspecified asthma, uncomplicated: J45.909

## 2018-09-22 LAB — CBC
HEMATOCRIT: 42.5 % (ref 36.0–46.0)
HEMOGLOBIN: 13.7 g/dL (ref 12.0–15.0)
MCH: 30 pg (ref 26.0–34.0)
MCHC: 32.2 g/dL (ref 30.0–36.0)
MCV: 93 fL (ref 80.0–100.0)
NRBC: 0 % (ref 0.0–0.2)
Platelets: 232 10*3/uL (ref 150–400)
RBC: 4.57 MIL/uL (ref 3.87–5.11)
RDW: 12.1 % (ref 11.5–15.5)
WBC: 4.9 10*3/uL (ref 4.0–10.5)

## 2018-09-22 NOTE — H&P (Signed)
Rebecca Shields is a 37 y.o. female, G: 0, who  presents for a  hysterectomy because of pelvic pain, uterine fibroids and endometriosis.  For the past 2 years the patient has experienced, random sharp and achy pelvic pain that would last only a few minutes.  She hasn't identified any triggers though  her pain was made worse with certain movements.  Along with this pain she had  pelvic pressure, bloating, lower back pain and deep dyspareunia.  She denied however, any changes in bowel or bladder function.  A pelvic ultrasound in May 2019 revealed:  an anteverted uterus measuring 4.3 cm x 8.2 cm x 5.7 cm and an  endometrium-0.9 cm;  multiple fibroids present with the largest measuring 2.1 cm; right ovary-3.7 cm and left ovary-1.7 cm.  Her menstrual flow was characterized by a 3 day flow with a pad change 4 times a day without cramping.   Options were given to the patient to manage and further evaluate her pain and she chose diagnostic laparoscopy,  in lieu of a trial of oral contraceptives.  The patient was found to have endometriosis that involved her bowel and. was placed on Orilissa 200 mg bid with marked improvement in her pain and dyspareunia.   Over time, however, her dosage was decreased due to  hot flashes, headache, worsening anxiety and fatigue.  In spite of this improvement with [name],  the patient desires definitive therapy in the form of hysterectomy.    Past Medical History  OB History: G:0  GYN History: menarche: 37 YO    LMP: 08/21/2018    Contraception: Vasectomy;   Denies history of abnormal PAP smears or sexually transmitted diseases.   Last PAP smear: 08/2017-normal  Medical History: Anxiety,  Hypercholesterolemia, Vitamin D Deficiency and  Cervical  Disc Disease  Surgical History: 2019 Diagnostic Laparoscopy-endometriosis Denies problems with anesthesia. 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  Family History: Diabetes Mellitus, Cardiovascular Disease,  Stroke, Hypertension and Asthma.  Social History: Married and employed as an Astronomer at [employer];  Denies tobacco use and rarely consumes alcohol   Medications: Ibuprofen 600 mg every 6 hours, with food prn Lovastatin 10 mg daily Multivitamin daily Orilissa 200 mg daily  Sertraline 25 mg daily Vitamin D 50,000 units weekly  No Known Allergies  Denies sensitivity to peanuts, shellfish, soy, latex or adhesives.  ROS: Admits to pelvic pain but denies headache, vision changes, nasal congestion, dysphagia, tinnitus, dizziness, hoarseness, cough,  chest pain, shortness of breath, nausea, vomiting, diarrhea,constipation,  urinary frequency, urgency  dysuria, hematuria, vaginitis symptoms,  swelling of joints,easy bruising,  myalgias, arthralgias, skin rashes, unexplained weight loss and except as is mentioned in the history of present illness, patient's review of systems is otherwise negative.     Physical Exam  Bp: 102/60   P: 97 bpm  R: 16  Temperature: 98.3 degrees F orally  Weight: 162 lbs.  Height: 5'1"  BMI: 30.6  Neck: supple without masses or thyromegaly Lungs: clear to auscultation Heart: regular rate and rhythm Abdomen: soft, non-tender and no organomegaly Pelvic (from 06/13/18-patient declined exam at pre-op) :EGBUS- wnl; vagina-normal rugae; uterus-8 weeks size and tender,  cervix without lesions or motion tenderness; adnexae-no tenderness or masses Extremities:  no clubbing, cyanosis or edema   Assesment:  Endometriosis                       Uterine Fibroids  Pelvic Pain   Disposition:  A discussion was held with patient regarding the indication for her procedure(s) along with the risks, which include but are not limited to: reaction to anesthesia, damage to adjacent organs especially bowel (given her history of endometriosis with bowel involvement), infection,  excessive bleeding and possible need to leave the cervix in place  which would require her to continue to have PAP smears according to current guidelines and may cause her to have occasional vaginal bleeding.  The patient verbalized understanding of these risks and has consented to proceed with a Total Abdominal Hysterectomy, Possible Supra-cervical Hysterectomy,  Bilateral Salpingectomy and Possible Bilateral Oophorectomy at [facility] on September 25, 2018 at 7:30 a.m.   CSN# [id number]   Aydee Mcnew J. Florene Glen, PA-C  for Dr.Vanessa P. Haygood

## 2018-09-24 DIAGNOSIS — N809 Endometriosis, unspecified: Secondary | ICD-10-CM | POA: Diagnosis present

## 2018-09-24 NOTE — Anesthesia Preprocedure Evaluation (Addendum)
Anesthesia Evaluation  Patient identified by MRN, date of birth, ID band Patient awake    Reviewed: Allergy & Precautions, NPO status , Patient's Chart, lab work & pertinent test results  Airway Mallampati: I  TM Distance: >3 FB Neck ROM: Full    Dental no notable dental hx. (+) Teeth Intact, Dental Advisory Given   Pulmonary asthma ,    Pulmonary exam normal breath sounds clear to auscultation       Cardiovascular negative cardio ROS Normal cardiovascular exam Rhythm:Regular Rate:Normal     Neuro/Psych PSYCHIATRIC DISORDERS Depression negative neurological ROS     GI/Hepatic negative GI ROS, Neg liver ROS,   Endo/Other  negative endocrine ROS  Renal/GU negative Renal ROS  negative genitourinary   Musculoskeletal negative musculoskeletal ROS (+)   Abdominal   Peds  Hematology  (+) REFUSES BLOOD PRODUCTS, Will accept albumin   Anesthesia Other Findings Uterine fibroids  Reproductive/Obstetrics                           Anesthesia Physical Anesthesia Plan  ASA: II  Anesthesia Plan: General   Post-op Pain Management:    Induction: Intravenous  PONV Risk Score and Plan: 3 and Midazolam, Ondansetron and Dexamethasone  Airway Management Planned: Oral ETT  Additional Equipment:   Intra-op Plan:   Post-operative Plan: Extubation in OR  Informed Consent: I have reviewed the patients History and Physical, chart, labs and discussed the procedure including the risks, benefits and alternatives for the proposed anesthesia with the patient or authorized representative who has indicated his/her understanding and acceptance.   Dental advisory given  Plan Discussed with: CRNA  Anesthesia Plan Comments:         Anesthesia Quick Evaluation

## 2018-09-25 ENCOUNTER — Other Ambulatory Visit: Payer: Self-pay

## 2018-09-25 ENCOUNTER — Encounter (HOSPITAL_COMMUNITY): Payer: Self-pay

## 2018-09-25 ENCOUNTER — Encounter (HOSPITAL_COMMUNITY)
Admission: RE | Disposition: A | Discharge status: Home / Self Care | Payer: Self-pay | Source: Home / Self Care | Attending: Obstetrics and Gynecology

## 2018-09-25 ENCOUNTER — Inpatient Hospital Stay (HOSPITAL_COMMUNITY)
Admission: RE | Admit: 2018-09-25 | Discharge: 2018-09-26 | DRG: 743 | Disposition: A | Discharge status: Home / Self Care | Payer: BLUE CROSS/BLUE SHIELD | Attending: Obstetrics and Gynecology | Admitting: Obstetrics and Gynecology

## 2018-09-25 ENCOUNTER — Inpatient Hospital Stay (HOSPITAL_COMMUNITY): Payer: BLUE CROSS/BLUE SHIELD | Admitting: Anesthesiology

## 2018-09-25 DIAGNOSIS — Z825 Family history of asthma and other chronic lower respiratory diseases: Secondary | ICD-10-CM

## 2018-09-25 DIAGNOSIS — E7800 Pure hypercholesterolemia, unspecified: Secondary | ICD-10-CM | POA: Diagnosis present

## 2018-09-25 DIAGNOSIS — Z8249 Family history of ischemic heart disease and other diseases of the circulatory system: Secondary | ICD-10-CM | POA: Diagnosis not present

## 2018-09-25 DIAGNOSIS — Z833 Family history of diabetes mellitus: Secondary | ICD-10-CM

## 2018-09-25 DIAGNOSIS — N941 Unspecified dyspareunia: Secondary | ICD-10-CM | POA: Diagnosis present

## 2018-09-25 DIAGNOSIS — Z531 Procedure and treatment not carried out because of patient's decision for reasons of belief and group pressure: Secondary | ICD-10-CM | POA: Diagnosis present

## 2018-09-25 DIAGNOSIS — J45909 Unspecified asthma, uncomplicated: Secondary | ICD-10-CM | POA: Diagnosis present

## 2018-09-25 DIAGNOSIS — D259 Leiomyoma of uterus, unspecified: Secondary | ICD-10-CM | POA: Diagnosis present

## 2018-09-25 DIAGNOSIS — F419 Anxiety disorder, unspecified: Secondary | ICD-10-CM | POA: Diagnosis present

## 2018-09-25 DIAGNOSIS — N809 Endometriosis, unspecified: Secondary | ICD-10-CM | POA: Diagnosis present

## 2018-09-25 DIAGNOSIS — M5090 Cervical disc disorder, unspecified, unspecified cervical region: Secondary | ICD-10-CM | POA: Diagnosis present

## 2018-09-25 DIAGNOSIS — E559 Vitamin D deficiency, unspecified: Secondary | ICD-10-CM | POA: Diagnosis present

## 2018-09-25 DIAGNOSIS — Z823 Family history of stroke: Secondary | ICD-10-CM | POA: Diagnosis not present

## 2018-09-25 DIAGNOSIS — N736 Female pelvic peritoneal adhesions (postinfective): Secondary | ICD-10-CM | POA: Diagnosis present

## 2018-09-25 HISTORY — PX: ABDOMINAL HYSTERECTOMY: SHX81

## 2018-09-25 LAB — PREGNANCY, URINE: Preg Test, Ur: NEGATIVE

## 2018-09-25 SURGERY — HYSTERECTOMY, ABDOMINAL
Anesthesia: General | Laterality: Bilateral

## 2018-09-25 MED ORDER — KETOROLAC TROMETHAMINE 30 MG/ML IJ SOLN
30.0000 mg | Freq: Four times a day (QID) | INTRAMUSCULAR | Status: AC
  Administered 2018-09-25 – 2018-09-26 (×4): 30 mg via INTRAVENOUS
  Filled 2018-09-25 (×4): qty 1

## 2018-09-25 MED ORDER — PROPOFOL 10 MG/ML IV BOLUS
INTRAVENOUS | Status: AC
  Filled 2018-09-25: qty 20

## 2018-09-25 MED ORDER — ONDANSETRON HCL 4 MG PO TABS
4.0000 mg | ORAL_TABLET | Freq: Four times a day (QID) | ORAL | Status: DC | PRN
  Administered 2018-09-25: 4 mg via ORAL
  Filled 2018-09-25: qty 1

## 2018-09-25 MED ORDER — DOCUSATE SODIUM 100 MG PO CAPS
100.0000 mg | ORAL_CAPSULE | Freq: Two times a day (BID) | ORAL | Status: DC
  Administered 2018-09-25 – 2018-09-26 (×2): 100 mg via ORAL
  Filled 2018-09-25 (×2): qty 1

## 2018-09-25 MED ORDER — ONDANSETRON HCL 4 MG/2ML IJ SOLN
INTRAMUSCULAR | Status: DC | PRN
  Administered 2018-09-25 (×2): 4 mg via INTRAVENOUS

## 2018-09-25 MED ORDER — ACETAMINOPHEN 500 MG PO TABS
1000.0000 mg | ORAL_TABLET | ORAL | Status: AC
  Administered 2018-09-25: 1000 mg via ORAL
  Filled 2018-09-25: qty 2

## 2018-09-25 MED ORDER — ARTIFICIAL TEARS OPHTHALMIC OINT
TOPICAL_OINTMENT | OPHTHALMIC | Status: AC
  Filled 2018-09-25: qty 3.5

## 2018-09-25 MED ORDER — LACTATED RINGERS IV SOLN
INTRAVENOUS | Status: DC
  Administered 2018-09-25: 1000 mL via INTRAVENOUS
  Administered 2018-09-25 (×2): via INTRAVENOUS

## 2018-09-25 MED ORDER — LIDOCAINE 2% (20 MG/ML) 5 ML SYRINGE
INTRAMUSCULAR | Status: AC
  Filled 2018-09-25: qty 5

## 2018-09-25 MED ORDER — TRAMADOL HCL 50 MG PO TABS
50.0000 mg | ORAL_TABLET | Freq: Four times a day (QID) | ORAL | Status: DC | PRN
  Administered 2018-09-25: 50 mg via ORAL
  Filled 2018-09-25 (×2): qty 1

## 2018-09-25 MED ORDER — SUGAMMADEX SODIUM 200 MG/2ML IV SOLN
INTRAVENOUS | Status: DC | PRN
  Administered 2018-09-25: 200 mg via INTRAVENOUS

## 2018-09-25 MED ORDER — HYDROMORPHONE HCL 1 MG/ML IJ SOLN
0.2500 mg | INTRAMUSCULAR | Status: DC | PRN
  Administered 2018-09-25 (×4): 0.5 mg via INTRAVENOUS

## 2018-09-25 MED ORDER — LIDOCAINE 2% (20 MG/ML) 5 ML SYRINGE
INTRAMUSCULAR | Status: DC | PRN
  Administered 2018-09-25: 80 mg via INTRAVENOUS

## 2018-09-25 MED ORDER — ONDANSETRON HCL 4 MG/2ML IJ SOLN
INTRAMUSCULAR | Status: AC
  Filled 2018-09-25: qty 2

## 2018-09-25 MED ORDER — MIDAZOLAM HCL 2 MG/2ML IJ SOLN
INTRAMUSCULAR | Status: DC | PRN
  Administered 2018-09-25: 2 mg via INTRAVENOUS

## 2018-09-25 MED ORDER — ROCURONIUM BROMIDE 10 MG/ML (PF) SYRINGE
PREFILLED_SYRINGE | INTRAVENOUS | Status: AC
  Filled 2018-09-25: qty 10

## 2018-09-25 MED ORDER — FENTANYL CITRATE (PF) 100 MCG/2ML IJ SOLN
25.0000 ug | INTRAMUSCULAR | Status: DC | PRN
  Administered 2018-09-25 (×3): 50 ug via INTRAVENOUS

## 2018-09-25 MED ORDER — LACTATED RINGERS IV SOLN
INTRAVENOUS | Status: DC
  Administered 2018-09-26: 04:00:00 via INTRAVENOUS

## 2018-09-25 MED ORDER — KETAMINE HCL 10 MG/ML IJ SOLN
INTRAMUSCULAR | Status: DC | PRN
  Administered 2018-09-25: 25 mg via INTRAVENOUS

## 2018-09-25 MED ORDER — SODIUM CHLORIDE 0.9 % IV SOLN
2.0000 g | INTRAVENOUS | Status: AC
  Administered 2018-09-25: 2 g via INTRAVENOUS
  Filled 2018-09-25: qty 2

## 2018-09-25 MED ORDER — SERTRALINE HCL 25 MG PO TABS
25.0000 mg | ORAL_TABLET | Freq: Every day | ORAL | Status: DC
  Administered 2018-09-25 – 2018-09-26 (×2): 25 mg via ORAL
  Filled 2018-09-25 (×2): qty 1

## 2018-09-25 MED ORDER — METRONIDAZOLE IN NACL 5-0.79 MG/ML-% IV SOLN
500.0000 mg | Freq: Once | INTRAVENOUS | Status: AC
  Administered 2018-09-25: 500 mg via INTRAVENOUS
  Filled 2018-09-25: qty 100

## 2018-09-25 MED ORDER — PROPOFOL 10 MG/ML IV BOLUS
INTRAVENOUS | Status: DC | PRN
  Administered 2018-09-25: 140 mg via INTRAVENOUS

## 2018-09-25 MED ORDER — IBUPROFEN 800 MG PO TABS: 800.0000 mg | ORAL_TABLET | Freq: Four times a day (QID) | ORAL | Status: DC

## 2018-09-25 MED ORDER — KETAMINE HCL 10 MG/ML IJ SOLN
INTRAMUSCULAR | Status: AC
  Filled 2018-09-25: qty 1

## 2018-09-25 MED ORDER — ONDANSETRON HCL 4 MG/2ML IJ SOLN: 4.0000 mg | Freq: Four times a day (QID) | INTRAMUSCULAR | Status: DC | PRN

## 2018-09-25 MED ORDER — OXYCODONE-ACETAMINOPHEN 5-325 MG PO TABS: 1.0000 | ORAL_TABLET | ORAL | Status: DC | PRN

## 2018-09-25 MED ORDER — BUPIVACAINE-EPINEPHRINE (PF) 0.25% -1:200000 IJ SOLN
INTRAMUSCULAR | Status: AC
  Filled 2018-09-25: qty 30

## 2018-09-25 MED ORDER — SODIUM CHLORIDE 0.9 % IV SOLN
INTRAVENOUS | Status: AC
  Filled 2018-09-25: qty 30

## 2018-09-25 MED ORDER — FENTANYL CITRATE (PF) 100 MCG/2ML IJ SOLN
INTRAMUSCULAR | Status: AC
  Filled 2018-09-25: qty 2

## 2018-09-25 MED ORDER — OXYCODONE-ACETAMINOPHEN 5-325 MG PO TABS
2.0000 | ORAL_TABLET | ORAL | Status: DC | PRN
  Administered 2018-09-25 – 2018-09-26 (×3): 2 via ORAL
  Filled 2018-09-25 (×3): qty 2

## 2018-09-25 MED ORDER — SUGAMMADEX SODIUM 200 MG/2ML IV SOLN
INTRAVENOUS | Status: AC
  Filled 2018-09-25: qty 2

## 2018-09-25 MED ORDER — LIDOCAINE 20MG/ML (2%) 15 ML SYRINGE OPTIME
INTRAMUSCULAR | Status: DC | PRN
  Administered 2018-09-25: 1.5 mg/kg/h via INTRAVENOUS

## 2018-09-25 MED ORDER — ROCURONIUM BROMIDE 50 MG/5ML IV SOSY
PREFILLED_SYRINGE | INTRAVENOUS | Status: DC | PRN
  Administered 2018-09-25: 30 mg via INTRAVENOUS
  Administered 2018-09-25 (×3): 20 mg via INTRAVENOUS
  Administered 2018-09-25: 50 mg via INTRAVENOUS

## 2018-09-25 MED ORDER — DIPHENHYDRAMINE HCL 50 MG/ML IJ SOLN
INTRAMUSCULAR | Status: AC
  Filled 2018-09-25: qty 1

## 2018-09-25 MED ORDER — SIMETHICONE 80 MG PO CHEW
80.0000 mg | CHEWABLE_TABLET | Freq: Four times a day (QID) | ORAL | Status: DC | PRN
  Administered 2018-09-25: 80 mg via ORAL
  Filled 2018-09-25: qty 1

## 2018-09-25 MED ORDER — 0.9 % SODIUM CHLORIDE (POUR BTL) OPTIME
TOPICAL | Status: DC | PRN
  Administered 2018-09-25: 2000 mL

## 2018-09-25 MED ORDER — FENTANYL CITRATE (PF) 250 MCG/5ML IJ SOLN
INTRAMUSCULAR | Status: AC
  Filled 2018-09-25: qty 5

## 2018-09-25 MED ORDER — KETOROLAC TROMETHAMINE 15 MG/ML IJ SOLN
INTRAMUSCULAR | Status: DC | PRN
  Administered 2018-09-25 (×2): 15 mg via INTRAVENOUS

## 2018-09-25 MED ORDER — MIDAZOLAM HCL 2 MG/2ML IJ SOLN
INTRAMUSCULAR | Status: AC
  Filled 2018-09-25: qty 2

## 2018-09-25 MED ORDER — DEXAMETHASONE SODIUM PHOSPHATE 10 MG/ML IJ SOLN
INTRAMUSCULAR | Status: AC
  Filled 2018-09-25: qty 1

## 2018-09-25 MED ORDER — FENTANYL CITRATE (PF) 100 MCG/2ML IJ SOLN
INTRAMUSCULAR | Status: DC | PRN
  Administered 2018-09-25 (×3): 50 ug via INTRAVENOUS
  Administered 2018-09-25: 25 ug via INTRAVENOUS
  Administered 2018-09-25 (×2): 50 ug via INTRAVENOUS

## 2018-09-25 MED ORDER — KETOROLAC TROMETHAMINE 30 MG/ML IJ SOLN
INTRAMUSCULAR | Status: AC
  Filled 2018-09-25: qty 1

## 2018-09-25 MED ORDER — BUPIVACAINE HCL (PF) 0.25 % IJ SOLN
INTRAMUSCULAR | Status: AC
  Filled 2018-09-25: qty 30

## 2018-09-25 MED ORDER — DIPHENHYDRAMINE HCL 50 MG/ML IJ SOLN
INTRAMUSCULAR | Status: DC | PRN
  Administered 2018-09-25: 12.5 mg via INTRAVENOUS

## 2018-09-25 MED ORDER — DEXAMETHASONE SODIUM PHOSPHATE 10 MG/ML IJ SOLN
INTRAMUSCULAR | Status: DC | PRN
  Administered 2018-09-25: 10 mg via INTRAVENOUS

## 2018-09-25 MED ORDER — BUPIVACAINE HCL (PF) 0.25 % IJ SOLN
INTRAMUSCULAR | Status: DC | PRN
  Administered 2018-09-25: 30 mL

## 2018-09-25 MED ORDER — HYDROMORPHONE HCL 1 MG/ML IJ SOLN
INTRAMUSCULAR | Status: AC
  Filled 2018-09-25: qty 2

## 2018-09-25 MED ORDER — MENTHOL 3 MG MT LOZG
1.0000 | LOZENGE | OROMUCOSAL | Status: DC | PRN
  Filled 2018-09-25: qty 9

## 2018-09-25 SURGICAL SUPPLY — 62 items
APPLICATOR COTTON TIP 6 STRL (MISCELLANEOUS) ×1 IMPLANT
APPLICATOR COTTON TIP 6IN STRL (MISCELLANEOUS) ×3
BARRIER ADHS 3X4 INTERCEED (GAUZE/BANDAGES/DRESSINGS) ×3 IMPLANT
BLADE EXTENDED COATED 6.5IN (ELECTRODE) ×3 IMPLANT
CANISTER SUCT 3000ML PPV (MISCELLANEOUS) IMPLANT
CATH ROBINSON RED A/P 16FR (CATHETERS) IMPLANT
CONT PATH 16OZ SNAP LID 3702 (MISCELLANEOUS) ×3 IMPLANT
COVER WAND RF STERILE (DRAPES) IMPLANT
DECANTER SPIKE VIAL GLASS SM (MISCELLANEOUS) ×3 IMPLANT
DERMABOND ADVANCED (GAUZE/BANDAGES/DRESSINGS) ×2
DERMABOND ADVANCED .7 DNX12 (GAUZE/BANDAGES/DRESSINGS) ×1 IMPLANT
DRAIN JACKSON PRT FLT 7MM (DRAIN) IMPLANT
DRAPE WARM FLUID 44X44 (DRAPE) ×3 IMPLANT
DRSG OPSITE POSTOP 4X10 (GAUZE/BANDAGES/DRESSINGS) ×3 IMPLANT
DURAPREP 26ML APPLICATOR (WOUND CARE) ×3 IMPLANT
ELECT BLADE 6.5 EXT (BLADE) IMPLANT
ELECT CAUTERY BLADE 6.4 (BLADE) IMPLANT
ELECT NEEDLE TIP 2.8 STRL (NEEDLE) ×3 IMPLANT
EVACUATOR SILICONE 100CC (DRAIN) IMPLANT
GAUZE 4X4 16PLY RFD (DISPOSABLE) ×3 IMPLANT
GLOVE BIOGEL PI IND STRL 7.0 (GLOVE) ×1 IMPLANT
GLOVE BIOGEL PI IND STRL 8.5 (GLOVE) ×1 IMPLANT
GLOVE BIOGEL PI INDICATOR 7.0 (GLOVE) ×2
GLOVE BIOGEL PI INDICATOR 8.5 (GLOVE) ×2
GLOVE SURG SS PI 6.5 STRL IVOR (GLOVE) ×3 IMPLANT
GLOVE SURG SS PI 8.0 STRL IVOR (GLOVE) ×6 IMPLANT
GOWN STRL REUS W/TWL LRG LVL3 (GOWN DISPOSABLE) ×6 IMPLANT
HOLDER FOLEY CATH W/STRAP (MISCELLANEOUS) ×3 IMPLANT
NEEDLE HYPO 22GX1.5 SAFETY (NEEDLE) ×3 IMPLANT
NEEDLE SPNL 22GX3.5 QUINCKE BK (NEEDLE) ×3 IMPLANT
NS IRRIG 1000ML POUR BTL (IV SOLUTION) ×3 IMPLANT
PACK ABDOMINAL GYN (CUSTOM PROCEDURE TRAY) ×3 IMPLANT
PAD OB MATERNITY 4.3X12.25 (PERSONAL CARE ITEMS) IMPLANT
PENCIL SMOKE EVAC W/HOLSTER (ELECTROSURGICAL) IMPLANT
PROTECTOR NERVE ULNAR (MISCELLANEOUS) ×3 IMPLANT
SHEET LAVH (DRAPES) IMPLANT
SPONGE LAP 18X18 RF (DISPOSABLE) ×6 IMPLANT
STAPLER VISISTAT 35W (STAPLE) IMPLANT
SUT MNCRL AB 3-0 PS2 18 (SUTURE) ×3 IMPLANT
SUT MNCRL AB 3-0 PS2 27 (SUTURE) IMPLANT
SUT PDS AB 1 CT  36 (SUTURE)
SUT PDS AB 1 CT 36 (SUTURE) IMPLANT
SUT PLAIN 2 0 XLH (SUTURE) IMPLANT
SUT SILK 0 FSL (SUTURE) IMPLANT
SUT VIC AB 0 CT1 18XCR BRD 8 (SUTURE) ×2 IMPLANT
SUT VIC AB 0 CT1 18XCR BRD8 (SUTURE) IMPLANT
SUT VIC AB 0 CT1 27 (SUTURE) ×6
SUT VIC AB 0 CT1 27XBRD ANBCTR (SUTURE) ×2 IMPLANT
SUT VIC AB 0 CT1 27XBRD ANTBC (SUTURE) ×1 IMPLANT
SUT VIC AB 0 CT1 27XCR 8 STRN (SUTURE) IMPLANT
SUT VIC AB 0 CT1 8-18 (SUTURE) ×4
SUT VIC AB 2-0 CT1 (SUTURE) ×3 IMPLANT
SUT VIC AB 2-0 CT1 36 (SUTURE) ×3 IMPLANT
SUT VIC AB 3-0 SH 27 (SUTURE) ×2
SUT VIC AB 3-0 SH 27X BRD (SUTURE) ×1 IMPLANT
SUT VICRYL 0 TIES 12 18 (SUTURE) ×3 IMPLANT
SYR 50ML LL SCALE MARK (SYRINGE) IMPLANT
SYR CONTROL 10ML LL (SYRINGE) ×3 IMPLANT
SYR TB 1ML 25GX5/8 (SYRINGE) IMPLANT
TOWEL OR 17X26 10 PK STRL BLUE (TOWEL DISPOSABLE) ×3 IMPLANT
TRAY FOLEY MTR SLVR 14FR STAT (SET/KITS/TRAYS/PACK) ×3 IMPLANT
UNDERPAD 30X30 (UNDERPADS AND DIAPERS) ×3 IMPLANT

## 2018-09-25 NOTE — Op Note (Signed)
09/25/2018  5:55 PM  PATIENT:  Rebecca Shields  37 y.o. female MRN:  [id number]  PRE-OPERATIVE DIAGNOSIS:  Uterine Fibroids, Endometriosis, Pelvic Pain, Dyspareunia  POST-OPERATIVE DIAGNOSIS:  Uterine Fibroids, Endometriosis, Pelvic Pain, Dyspareunia  PROCEDURE:  Procedure(s): SUPRACERVICAL ABDOMINAL HYSTERECTOMY, BILATERAL SALPINGECTOMY  SURGEON:  Surgeon(s): Hollie Bartus, Seymour Bars, MD Ena Dawley, MD  ASSISTANTS: Eli Hose MD   ANESTHESIA:  General  ESTIMATED BLOOD LOSS: 462 mL   COMPLICATIONS:none  BLOOD ADMINISTERED:none  DRAINS: Urinary Catheter (Foley)   LOCAL MEDICATIONS USED:  MARCAINE   30cc SPECIMEN:  Source of Specimen:  Uterine fundus, bilateral fallopian tubes  DISPOSITION OF SPECIMEN:  PATHOLOGY  COUNTS:  YES  PROCEDURE: The patient was taken to the operating room after appropriate identification and placed on the operating table in the supine position. Equipment for the induction of general anesthesia was placed and after the attainment of adequate general anesthesia, timeout was performed, then the abdomen, perineum, and vagina were prepped with multiple layers of Betadine.a Foley catheter was inserted into the bladder under sterile conditions and connected to straight drainage. The abdomen was draped as a sterile field.    Suprapubic injection of 15 cc of quarter percent Marcaine was undertaken. A suprapubic incision was made and the abdomen opened in layers. Peritoneum was entered and a self-retaining retractor placed in the abdominal cavity. A bladder blade was placed.  After visual and palpable intraperitoneal evaluation of the anatomy was carried out large Kelly clamps were placed at the cornual regions of the uterus.  The right ovary was noted to be densly adherent to the posterior uterus.  A combination of blunt and sharp dissection allowed the ovary to be dissected off the posterior uterus.  The sigmoid colon was noted to be densely adherent  to the posterior uterus even above the level of the uterosacral ligament.  The left ovary was adherent to the posterior uterus but with minor adhesions then on the other side and was easily dissected off the posterior uterus.    The left round ligament was then identified clamped cut and suture ligated. That incision was taken anteriorly on the anterior leaf of the broad ligament. The utero-ovarian ligament was identified clamped cut, free tied and suture-ligated. A similar procedure was carried out on the opposite side with the round ligament and utero-ovarian ligament. The bladder flap was completed on the opposite side with incision of the anterior leaf of the broad ligament. The bladder was dissected off the anterior cervix with a combination of blunt and sharp dissection.  Additional sharp dissection posteriorly was significantly difficult to make the decision that once the uterine arteries could be skeletonized no further dissection would be undertaken and a supracervical hysterectomy would be performed.    The uterine arteries on the right and left side were clamped cut and suture ligated.  The uterine fundus was then excised from the cervix, moved from the operative field and the endocervical canal cauterized.  Angle sutures were then placed laterally and the cut edge of the cervix noted to be hemostatic.  Copious irrigation was carried out.  The left fallopian tube was then grasped with a Babcock clamp elevated and the mesosalpinx clamped cut and suture ligated with 2 sutures of 0 Vicryl.  The stasis was noted to be adequate and a similar procedure was carried out on the opposite side.  Suture was placed through the portion of the left fallopian tube which was excised and both were removed from the operative field.  Interceed was placed over the right ovary which contain significant adhesions and areas of dissection off the posterior uterus.  A piece of Interceed was placed over the cervical  stump.  Hemostasis was noted to be adequate and all instruments were removed from the peritoneal cavity.  The abdominal peritoneum was closed using running suture of 2-0 Vicryl.  The rectus fascia was closed with running sutures of 0 Vicryl from each apex to  the midline and tied in the midline. The subcutaneous tissue was made hemostatic with Bovie cautery and irrigated. The skin incision was closed with a subcuticular suture of 3-0 Monocryl.  Dermabond was applied .  A sterile dressing was applied. The patient was awakened from general anesthesia and taken to the recovery room in satisfactory condition having tolerated the procedure well with  sponge and instrument counts correct.  PLAN OF CARE: Admit after postanesthesia care  PATIENT DISPOSITION:  PACU - hemodynamically stable.   Delay start of Pharmacological VTE agent (>24hrs) due to surgical blood loss or risk of bleeding:  Yes.  SCD hose were used through the entire case   Seymour Bars Rebecca Shields 5:55 PM

## 2018-09-25 NOTE — Progress Notes (Signed)
Day of Surgery Procedure(s) (LRB): SUPRACERVICAL ABDOMINAL HYSTERECTOMY, BILATERAL SALPINGECTOMY (Bilateral)  Subjective: Patient reports no nausea, vomiting.  incisional pain controlled with pos meds  and tolerating PO.    Objective: I have reviewed patient's vital signs, intake and output and medications.  General: alert, cooperative and no distress Resp: clear to auscultation bilaterally Cardio: regular rate and rhythm, S1, S2 normal, no murmur, click, rub or gallop GI: soft and appropriately tender for day of surgery Extremities: extremities normal, atraumatic, no cyanosis or edema Vaginal Bleeding: none No intake/output data recorded. Total I/O In: 3060.3 [P.O.:240; I.V.:2720.3; IV Piggyback:100] Out: 600 [Urine:450; Blood:150]   Assessment: s/p Procedure(s): SUPRACERVICAL ABDOMINAL HYSTERECTOMY, BILATERAL SALPINGECTOMY (Bilateral): stable, progressing well and tolerating diet  Plan: Advance diet Encourage ambulation Tolerating PO medication Consider d/c/ home 09/26/18  LOS: 0 days    Eldred Manges 09/25/2018, 5:38 PM

## 2018-09-25 NOTE — H&P (Signed)
History and Physical Interval Note:   09/25/2018   7:20 AM   [place] A Tejada  has presented today for surgery, with the diagnosis of Uterine Fibroids, Endometriosis, Pelvic Pain, Dyspareunia  The various methods of treatment have been discussed with the patient and family. After consideration of risks, benefits and other options for treatment, the patient has consented to  Procedure(s): HYSTERECTOMY ABDOMINAL BILATERAL SALPINGECTOMY, POSSIBLE OOPHORECTOMY AND POSSIBLE SUPRACERVICAL HYSTERECTOMY  as a surgical intervention .The  Pt is aware of the possible need for supracervical hysterectomy, and the possible need for the removal of one or both ovaries.   The patient declines blood transfusion and has provided a Special educational needs teacher of attorney stating that.  CellSaver technology is permissible per the pt and that is available to [place].    I have reviewed the patients' chart and labs.  Questions were answered to the patient's satisfaction.     Eldred Manges  MD

## 2018-09-25 NOTE — Anesthesia Procedure Notes (Signed)
Procedure Name: Intubation Date/Time: 09/25/2018 7:35 AM Performed by: Genelle Bal, CRNA Pre-anesthesia Checklist: Patient identified, Emergency Drugs available, Suction available and Patient being monitored Patient Re-evaluated:Patient Re-evaluated prior to induction Oxygen Delivery Method: Circle system utilized Preoxygenation: Pre-oxygenation with 100% oxygen Induction Type: IV induction Ventilation: Mask ventilation without difficulty Laryngoscope Size: Miller and 2 Grade View: Grade I Tube type: Oral Tube size: 7.0 mm Number of attempts: 1 Airway Equipment and Method: Stylet Placement Confirmation: ETT inserted through vocal cords under direct vision,  positive ETCO2 and breath sounds checked- equal and bilateral Secured at: 21 cm Tube secured with: Tape Dental Injury: Teeth and Oropharynx as per pre-operative assessment

## 2018-09-25 NOTE — Transfer of Care (Signed)
Immediate Anesthesia Transfer of Care Note  Patient: Rebecca Shields  Procedure(s) Performed: SUPRACERVICAL ABDOMINAL HYSTERECTOMY, BILATERAL SALPINGECTOMY (Bilateral )  Patient Location: PACU  Anesthesia Type:General  Level of Consciousness: awake, drowsy and responds to stimulation  Airway & Oxygen Therapy: Patient Spontanous Breathing and Patient connected to face mask oxygen  Post-op Assessment: Report given to RN and Post -op Vital signs reviewed and stable  Post vital signs: Reviewed and stable  Last Vitals:  Vitals Value Taken Time  BP 137/96 09/25/2018 11:11 AM  Temp    Pulse 101 09/25/2018 11:14 AM  Resp 17 09/25/2018 11:14 AM  SpO2 100 % 09/25/2018 11:14 AM  Vitals shown include unvalidated device data.  Last Pain:  Vitals:   09/25/18 0609  TempSrc:   PainSc: 0-No pain      Patients Stated Pain Goal: 3 (70/01/74 9449)  Complications: No apparent anesthesia complications

## 2018-09-25 NOTE — Anesthesia Postprocedure Evaluation (Signed)
Anesthesia Post Note  Patient: Elisama A Steffenhagen  Procedure(s) Performed: SUPRACERVICAL ABDOMINAL HYSTERECTOMY, BILATERAL SALPINGECTOMY (Bilateral )     Patient location during evaluation: PACU Anesthesia Type: General Level of consciousness: awake and alert Pain management: pain level controlled Vital Signs Assessment: post-procedure vital signs reviewed and stable Respiratory status: spontaneous breathing, nonlabored ventilation, respiratory function stable and patient connected to nasal cannula oxygen Cardiovascular status: blood pressure returned to baseline and stable Postop Assessment: no apparent nausea or vomiting Anesthetic complications: no    Last Vitals:  Vitals:   09/25/18 1500 09/25/18 1555  BP: 120/66 125/84  Pulse: 84 72  Resp: 17 17  Temp: 36.8 C 36.7 C  SpO2: 100%     Last Pain:  Vitals:   09/25/18 1555  TempSrc: Oral  PainSc:                  Alexxis Mackert L Jsoeph Podesta

## 2018-09-26 ENCOUNTER — Encounter (HOSPITAL_COMMUNITY): Payer: Self-pay | Admitting: Obstetrics and Gynecology

## 2018-09-26 LAB — CBC
HCT: 35.9 % — ABNORMAL LOW (ref 36.0–46.0)
Hemoglobin: 11.4 g/dL — ABNORMAL LOW (ref 12.0–15.0)
MCH: 29.2 pg (ref 26.0–34.0)
MCHC: 31.8 g/dL (ref 30.0–36.0)
MCV: 91.8 fL (ref 80.0–100.0)
Platelets: 238 10*3/uL (ref 150–400)
RBC: 3.91 MIL/uL (ref 3.87–5.11)
RDW: 12.3 % (ref 11.5–15.5)
WBC: 8.1 10*3/uL (ref 4.0–10.5)
nRBC: 0 % (ref 0.0–0.2)

## 2018-09-26 MED ORDER — IBUPROFEN 600 MG PO TABS: ORAL_TABLET | ORAL | 0 refills | Status: AC

## 2018-09-26 MED ORDER — TRAMADOL HCL 50 MG PO TABS: ORAL_TABLET | ORAL | 0 refills | Status: AC

## 2018-09-26 MED ORDER — ONDANSETRON HCL 4 MG PO TABS: 4.0000 mg | ORAL_TABLET | Freq: Four times a day (QID) | ORAL | 0 refills | Status: AC | PRN

## 2018-09-26 MED ORDER — OXYCODONE-ACETAMINOPHEN 5-325 MG PO TABS: 1.0000 | ORAL_TABLET | ORAL | 0 refills | Status: AC | PRN

## 2018-09-26 MED ORDER — DOCUSATE SODIUM 100 MG PO CAPS: ORAL_CAPSULE | ORAL | 0 refills | Status: AC

## 2018-09-26 MED ORDER — SIMETHICONE 80 MG PO CHEW: 80.0000 mg | CHEWABLE_TABLET | Freq: Four times a day (QID) | ORAL | 0 refills | Status: AC | PRN

## 2018-09-26 MED FILL — traMADol HCL 50 MG TABS: 50 | 5 days supply | Qty: 40 | Fill #0

## 2018-09-26 MED FILL — IBUPROFEN 600 MG TABLET: 600 | 7 days supply | Qty: 30 | Fill #0

## 2018-09-26 MED FILL — OXYCODONE-ACETAMINOPHEN 5-3: 5-325 | 4 days supply | Qty: 40 | Fill #0

## 2018-09-26 MED FILL — ONDANSETRON HCL 4 MG TABLET: 4 | 5 days supply | Qty: 20 | Fill #0

## 2018-09-26 NOTE — Progress Notes (Signed)
Pt was discharged home today. Instructions were reviewed with patient, and questions were answered. Pt was taken to main entrance via wheelchair by NT.  

## 2018-09-26 NOTE — Discharge Instructions (Signed)
Abdominal Hysterectomy, Care After °This sheet gives you information about how to care for yourself after your procedure. Your doctor may also give you more specific instructions. If you have problems or questions, contact your doctor. °Follow these instructions at home: °Bathing °· Do not take baths, swim, or use a hot tub until your doctor says it is okay. Ask your doctor if you can take showers. You may only be allowed to take sponge baths for bathing. °· Keep the bandage (dressing) dry until your doctor says it can be taken off. °Surgical cut (incision) care ° °  ° °· Follow instructions from your doctor about how to take care of your cut from surgery. Make sure you: °? Wash your hands with soap and water before you change your bandage (dressing). If you cannot use soap and water, use hand sanitizer. °? Change your bandage as told by your doctor. °? Leave stitches (sutures), skin glue, or skin tape (adhesive) strips in place. They may need to stay in place for 2 weeks or longer. If tape strips get loose and curl up, you may trim the loose edges. Do not remove tape strips completely unless your doctor says it is okay. °· Check your surgical cut area every day for signs of infection. Check for: °? Redness, swelling, or pain. °? Fluid or blood. °? Warmth. °? Pus or a bad smell. °Activity °· Do gentle, daily exercise as told by your doctor. You may be told to take short walks every day and go farther each time. °· Do not lift anything that is heavier than 10 lb (4.5 kg), or the limit that your doctor tells you, until he or she says that it is safe. °· Do not drive or use heavy machinery while taking prescription pain medicine. °· Do not drive for 24 hours if you were given a medicine to help you relax (sedative). °· Follow your doctor's advice about exercise, driving, and general activities. Ask your doctor what activities are safe for you. °Lifestyle ° °· Do not douche, use tampons, or have sex for at least 6 weeks  or as told by your doctor. °· Do not drink alcohol until your doctor says it is okay. °· Drink enough fluid to keep your pee (urine) clear or pale yellow. °· Try to have someone at home with you for the first 1-2 weeks to help. °· Do not use any products that contain nicotine or tobacco, such as cigarettes and e-cigarettes. These can slow down healing. If you need help quitting, ask your doctor. °General instructions °· Take over-the-counter and prescription medicines only as told by your doctor. °· Do not take aspirin or ibuprofen. These medicines can cause bleeding. °· To prevent or treat constipation while you are taking prescription pain medicine, your doctor may suggest that you: °? Drink enough fluid to keep your urine clear or pale yellow. °? Take over-the-counter or prescription medicines. °? Eat foods that are high in fiber, such as: °§ Fresh fruits and vegetables. °§ Whole grains. °§ Beans. °? Limit foods that are high in fat and processed sugars, such as fried and sweet foods. °· Keep all follow-up visits as told by your doctor. This is important. °Contact a doctor if: °· You have chills or fever. °· You have redness, swelling, or pain around your cut. °· You have fluid or blood coming from your cut. °· Your cut feels warm to the touch. °· You have pus or a bad smell coming from your cut. °·   Your cut breaks open. °· You feel dizzy or light-headed. °· You have pain or bleeding when you pee. °· You keep having watery poop (diarrhea). °· You keep feeling sick to your stomach (nauseous) or keep throwing up (vomiting). °· You have unusual fluid (discharge) coming from your vagina. °· You have a rash. °· You have a reaction to your medicine. °· Your pain medicine does not help. °Get help right away if: °· You have a fever and your symptoms get worse all of a sudden. °· You have very bad belly (abdominal) pain. °· You are short of breath. °· You pass out (faint). °· You have pain, swelling, or redness of your  leg. °· You bleed a lot from your vagina and notice clumps of blood (clots). °Summary °· Do not take baths, swim, or use a hot tub until your doctor says it is okay. Ask your doctor if you can take showers. You may only be allowed to take sponge baths for bathing. °· Follow your doctor's advice about exercise, driving, and general activities. Ask your doctor what activities are safe for you. °· Do not lift anything that is heavier than 10 lb (4.5 kg), or the limit that your doctor tells you, until he or she says that it is safe. °· Try to have someone at home with you for the first 1-2 weeks to help. °This information is not intended to replace advice given to you by your health care provider. Make sure you discuss any questions you have with your health care provider. °Document Released: 06/29/2008 Document Revised: 09/08/2016 Document Reviewed: 09/08/2016 °Elsevier Interactive Patient Education © 2019 [employer] ° °

## 2018-09-26 NOTE — Discharge Summary (Signed)
Physician Discharge Summary  Patient ID: Rebecca Shields MRN: [id number] DOB/AGE: [birth year] 37 y.o.  Admit date: 09/25/2018 Discharge date: 09/26/2018  Admission Diagnoses:Pelvic Pain, Endometriosis, fibroids  Discharge Diagnoses:  Active Problems:   Endometriosis determined by laparoscopy   Fibroid uterus   Pelvic peritoneal adhesions, female   Endometriosis   Discharged Condition: stable  Hospital Course: The patient was admitted to the hospital after undergoing supracervical hysterectomy and bilateral salpingectomy with lysis of adhesions.  She did well postoperativel.  She remained afebrile with stable vital signs and progressed to oral pain medication with good relief, a regular diet, and spontaneous voiding.  She was thus deemed ready for discharge home.  Consults: None  Significant Diagnostic Studies: labs:   Results for orders placed or performed during the hospital encounter of 09/25/18 (from the past 72 hour(s))  Pregnancy, urine     Status: None   Collection Time: 09/25/18  5:45 AM  Result Value Ref Range   Preg Test, Ur NEGATIVE NEGATIVE    Comment:        THE SENSITIVITY OF THIS METHODOLOGY IS >20 mIU/mL. Performed at [facility], [place] [address] [place], [place] [address]   CBC     Status: Abnormal   Collection Time: 09/26/18  4:30 AM  Result Value Ref Range   WBC 8.1 4.0 - 10.5 K/uL   RBC 3.91 3.87 - 5.11 MIL/uL   Hemoglobin 11.4 (L) 12.0 - 15.0 g/dL   HCT 35.9 (L) 36.0 - 46.0 %   MCV 91.8 80.0 - 100.0 fL   MCH 29.2 26.0 - 34.0 pg   MCHC 31.8 30.0 - 36.0 g/dL   RDW 12.3 11.5 - 15.5 %   Platelets 238 150 - 400 K/uL   nRBC 0.0 0.0 - 0.2 %    Comment: Performed at [facility], [place] [address], [place], [place] [address]    Treatments: IV hydration, antibiotics: metronidazole and cefotetan for surgical prophylaxis, analgesia: acetaminophen and tramadol and Percocet and surgery: supracervical hysterectomy and bilateral  salpingectomy.  Discharge Exam: Blood pressure 116/83, pulse 90, temperature 98.4 F (36.9 C), temperature source Oral, resp. rate 20, height 5\' 1"  (1.549 m), weight 71.2 kg, last menstrual period 07/21/2018, SpO2 99 %. General appearance: alert, cooperative, appears stated age and no distress Resp: clear to auscultation bilaterally Cardio: regular rate and rhythm, S1, S2 normal, no murmur, click, rub or gallop GI: soft, non-tender; bowel sounds normal; no masses,  no organomegaly and appropriately tender for postoperative day #1 Extremities: extremities normal, atraumatic, no cyanosis or edema Incision/Wound:Clean and dry   Disposition:   Discharge Home Maintain honeycomb dressing for 2 more days at home. Reviewed discharge instructions, followup, and pain management regimen.      Signed: Seymour Bars Liviya Santini 09/26/2018, 10:30 AM

## 2018-10-03 ENCOUNTER — Other Ambulatory Visit: Payer: Self-pay | Admitting: Obstetrics and Gynecology

## 2018-10-03 DIAGNOSIS — N6310 Unspecified lump in the right breast, unspecified quadrant: Secondary | ICD-10-CM

## 2018-10-10 ENCOUNTER — Ambulatory Visit
Admission: RE | Admit: 2018-10-10 | Discharge: 2018-10-10 | Disposition: A | Discharge status: Home / Self Care | Payer: BLUE CROSS/BLUE SHIELD | Source: Ambulatory Visit | Attending: Obstetrics and Gynecology | Admitting: Obstetrics and Gynecology

## 2018-10-10 DIAGNOSIS — N6310 Unspecified lump in the right breast, unspecified quadrant: Secondary | ICD-10-CM

## 2018-11-03 ENCOUNTER — Other Ambulatory Visit: Payer: BLUE CROSS/BLUE SHIELD

## 2020-08-03 IMAGING — MG DIGITAL DIAGNOSTIC BILATERAL MAMMOGRAM WITH TOMO AND CAD
6 of 10 series · 6 of 30 positions shown · non-contrast
Comparison: None.

CLINICAL DATA: 38-year-old female with a palpable area of concern
with associated tenderness in the lower inner right breast. Patient
is also experiencing bilateral nonfocal superior breast pain.

EXAM:
DIGITAL DIAGNOSTIC BILATERAL MAMMOGRAM WITH CAD AND TOMO
RIGHT BREAST ULTRASOUND

[R CC synth-2D]
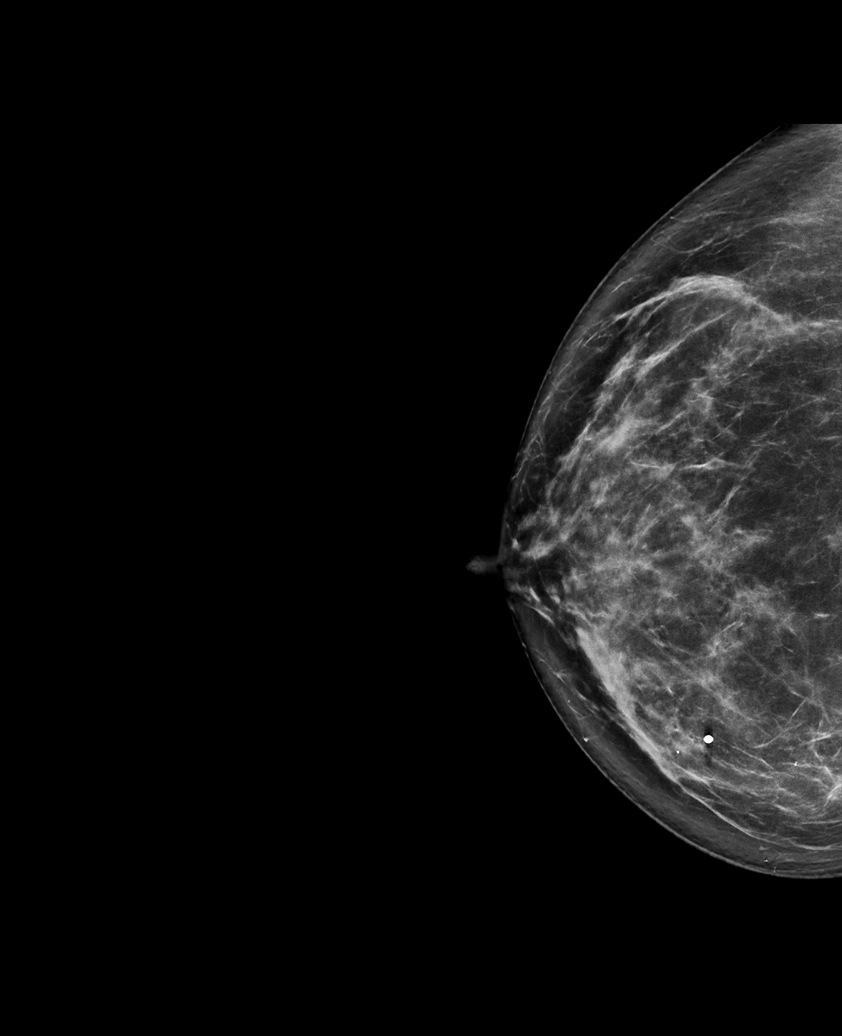

[R TAN synth-2D]
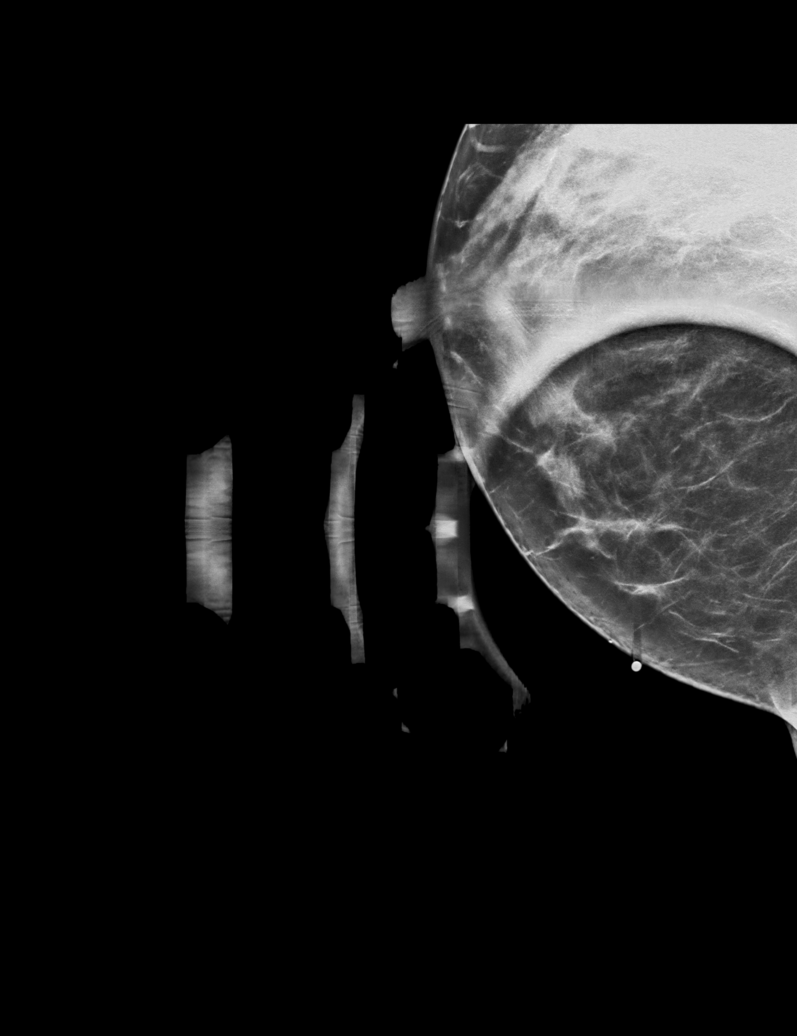

[R MLO synth-2D]
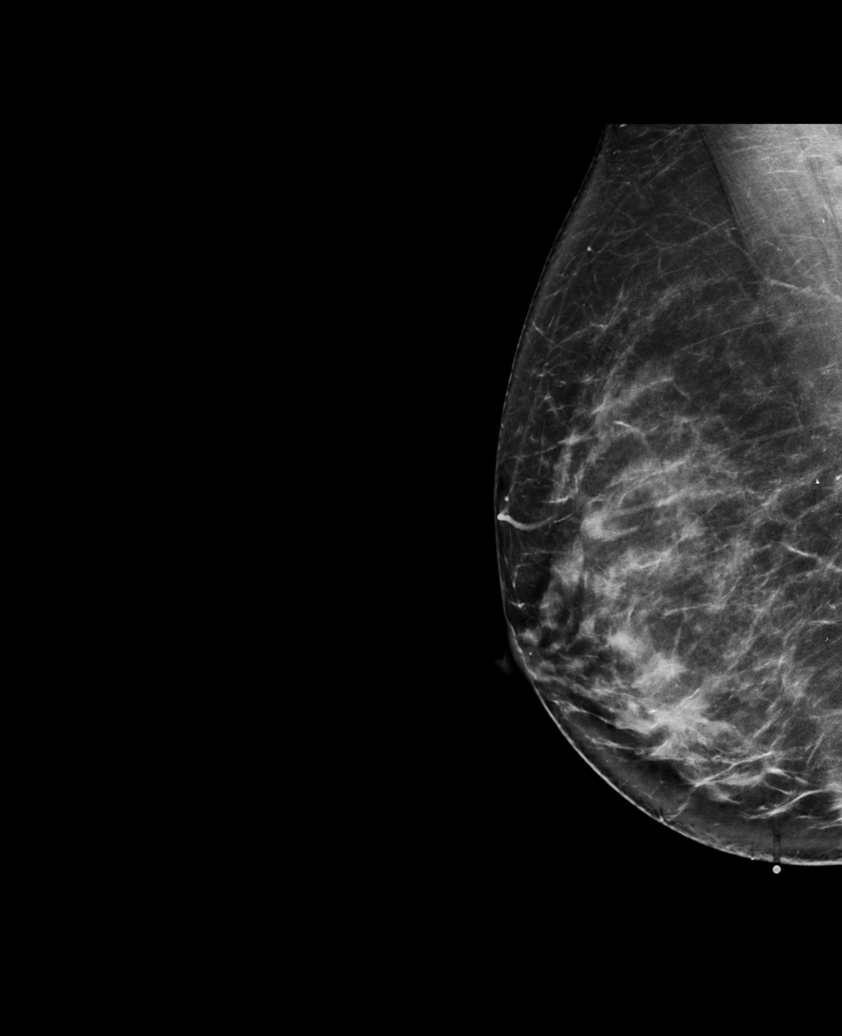

[L MLO synth-2D]
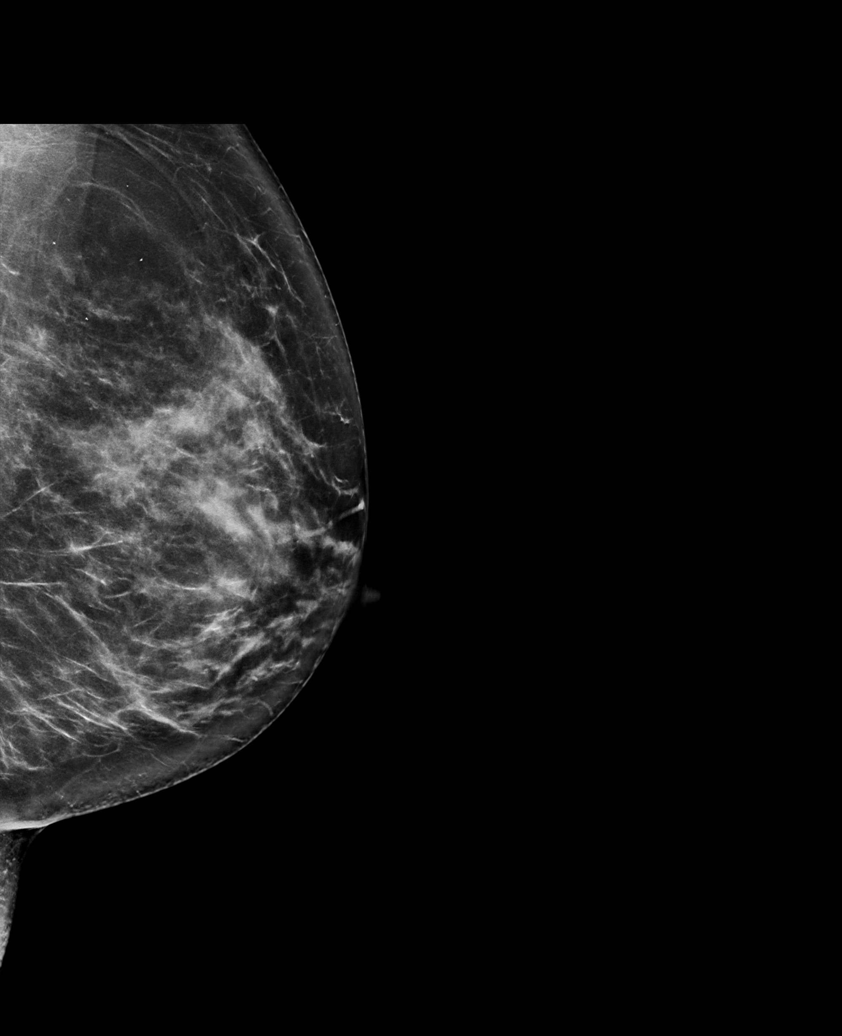

[L CC synth-2D]
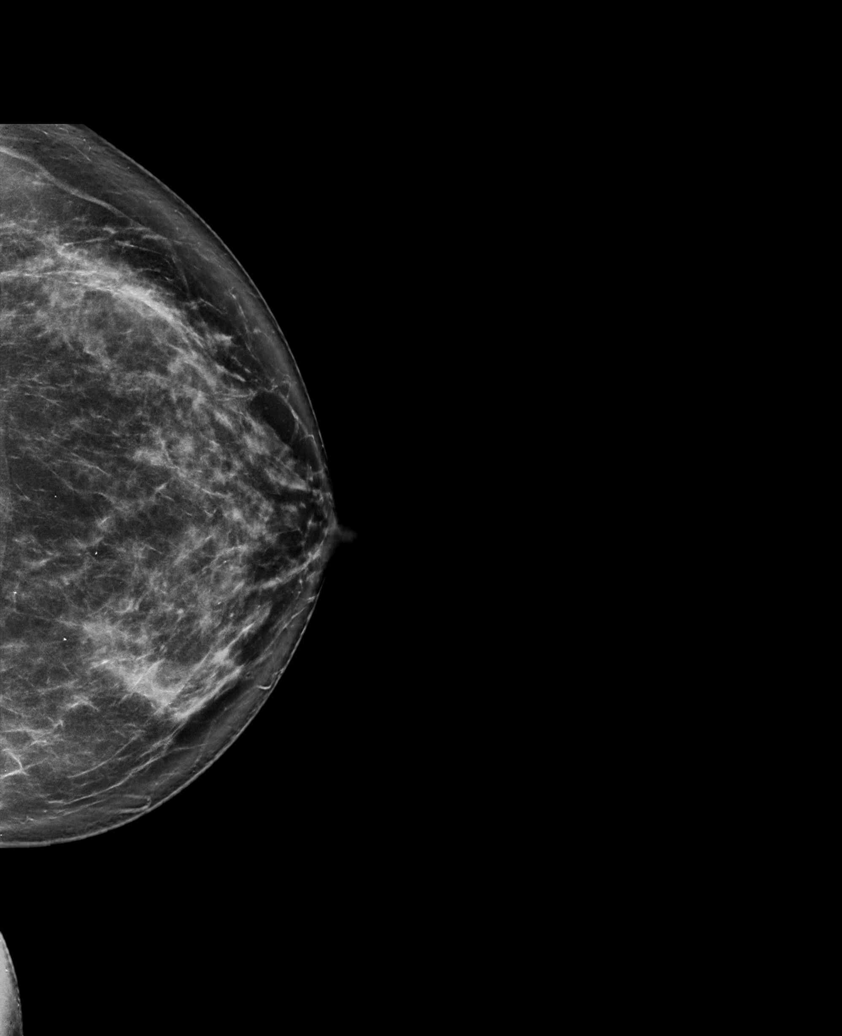

[R TAN tomo · tomo slice 35/69.0]
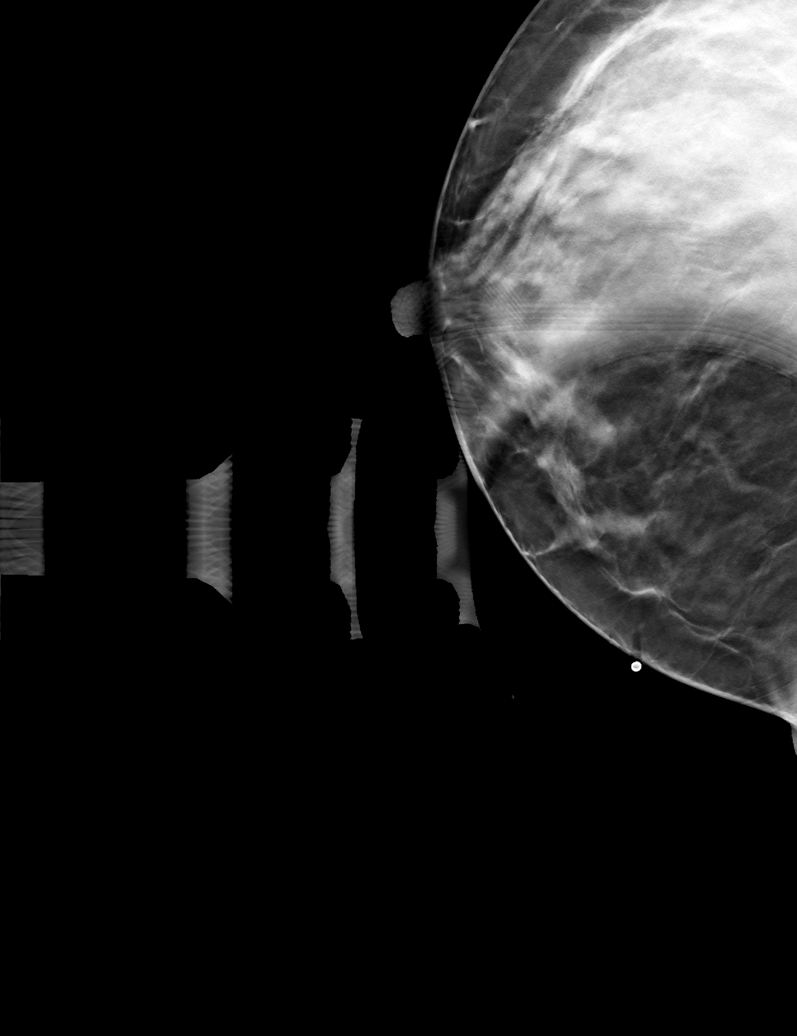

[6 of 30 positions shown; findings below may reference images not displayed]

ACR Breast Density Category c: The breast tissue is heterogeneously
dense, which may obscure small masses.
FINDINGS: No suspicious masses or calcifications are seen in either breast.
Spot compression tangential tomograms were performed over the
palpable area of concern in the lower inner right breast with only
dense fibroglandular tissue identified.

Mammographic images were processed with CAD.

Physical examination at site of palpable concern in the lower inner
right breast reveals soft thickening without a discrete underlying
palpable mass.

Targeted ultrasound of the lower inner right breast was performed.
No suspicious masses or abnormality seen, only heterogeneous
fibroglandular tissue identified.
IMPRESSION: 1.  No findings of malignancy in either breast.

2. No suspicious abnormalities identified at site of palpable
concern in the right breast.

RECOMMENDATION:
1. Recommend further management of the right breast palpable
abnormality be based on clinical assessment.

2. Screening mammogram at age 41 unless there are persistent or
intervening clinical concerns. (Code:FL-I-WSA)

I have discussed the findings and recommendations with the patient.
Results were also provided in writing at the conclusion of the
visit. If applicable, a reminder letter will be sent to the patient
regarding the next appointment.

BI-RADS CATEGORY  1: Negative.

## 2020-08-03 IMAGING — US ULTRASOUND RIGHT BREAST LIMITED
1 series · 5 of 5 positions shown · non-contrast
Comparison: None.

CLINICAL DATA: 38-year-old female with a palpable area of concern
with associated tenderness in the lower inner right breast. Patient
is also experiencing bilateral nonfocal superior breast pain.

EXAM:
DIGITAL DIAGNOSTIC BILATERAL MAMMOGRAM WITH CAD AND TOMO
RIGHT BREAST ULTRASOUND

[Series 1: ultrasound right breast limited · 0.07mm/px · 5 of 5 slices shown]
[im 1/5]
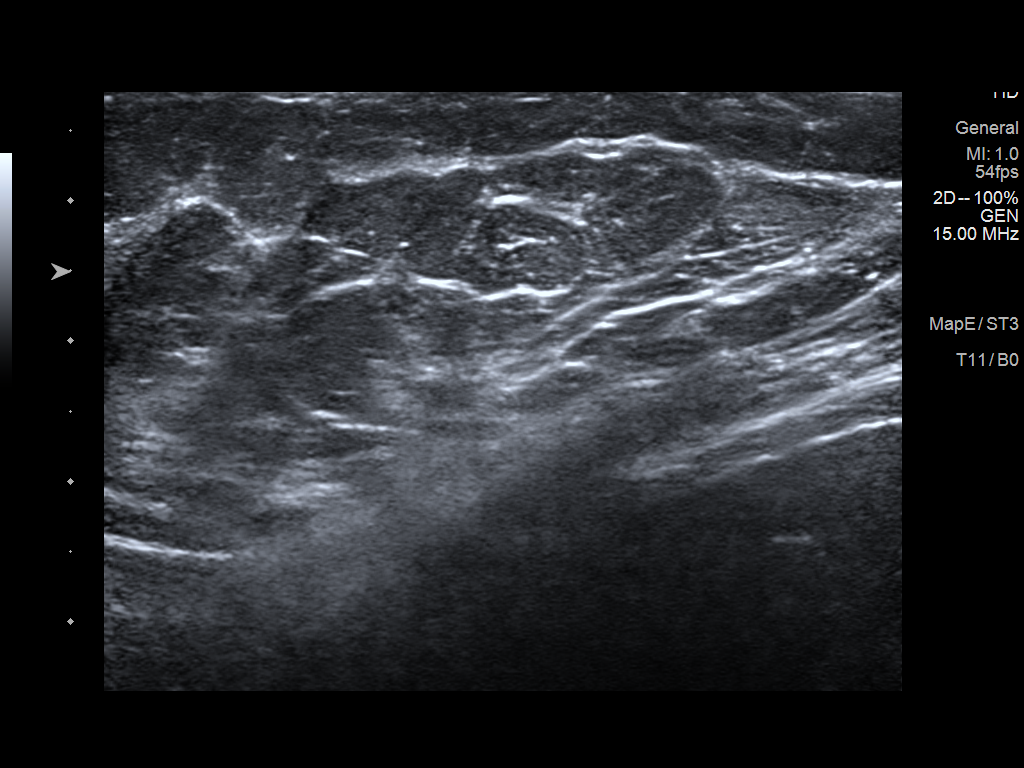
[im 2/5]
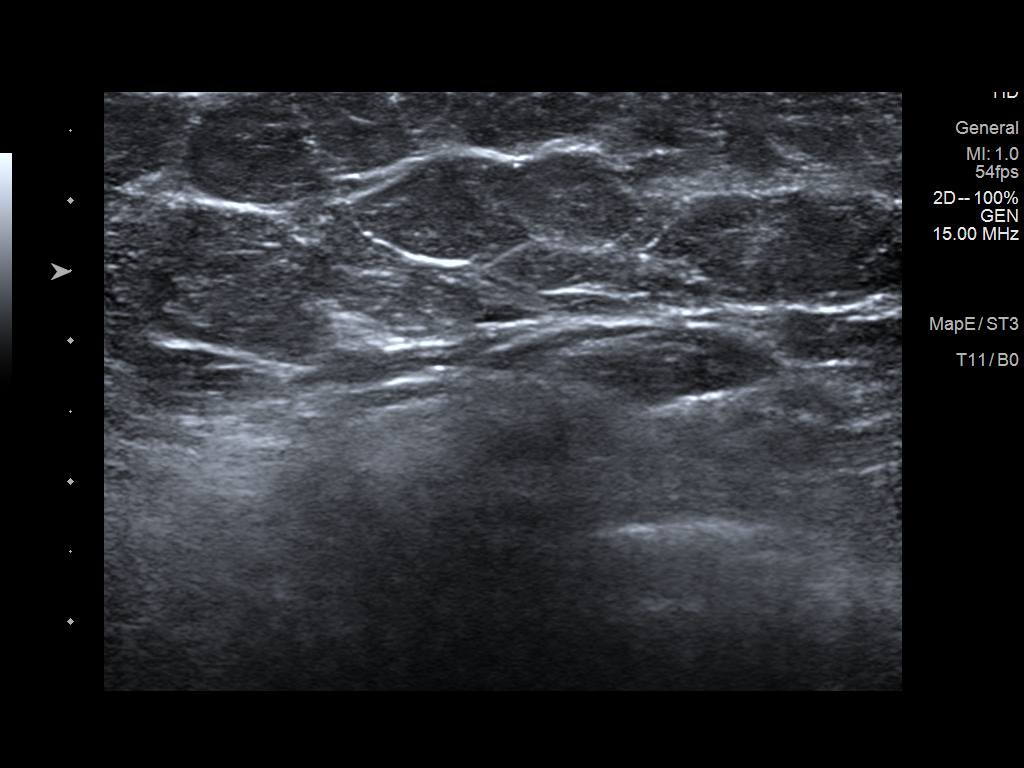
[im 3/5]
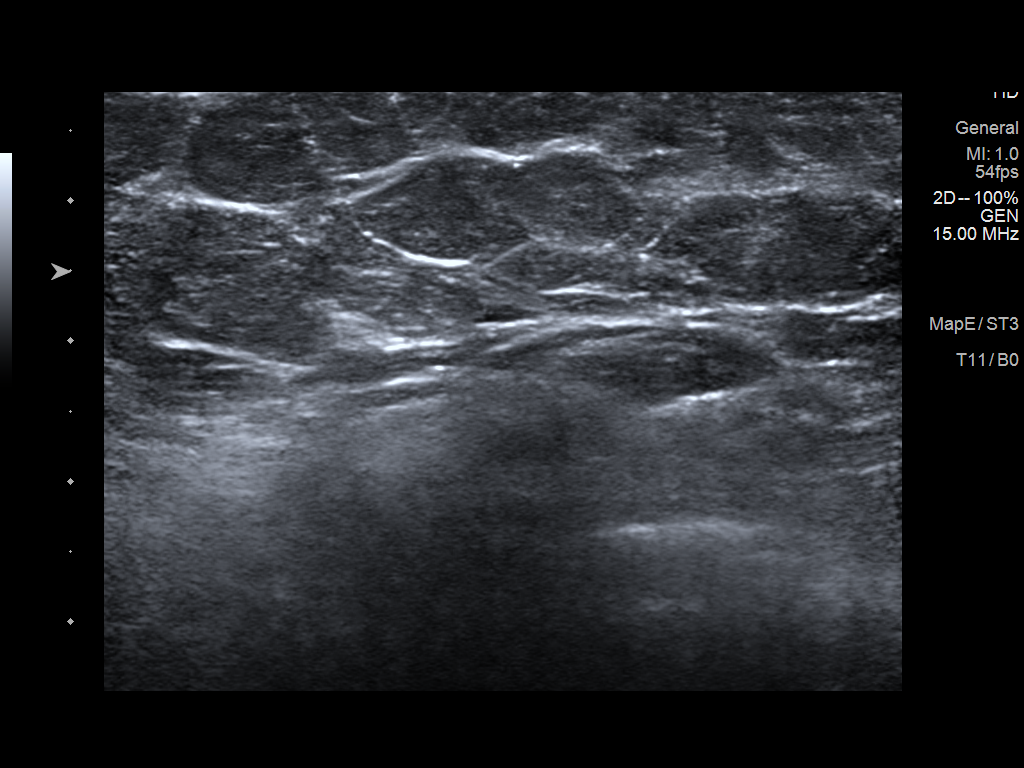
[im 4/5]
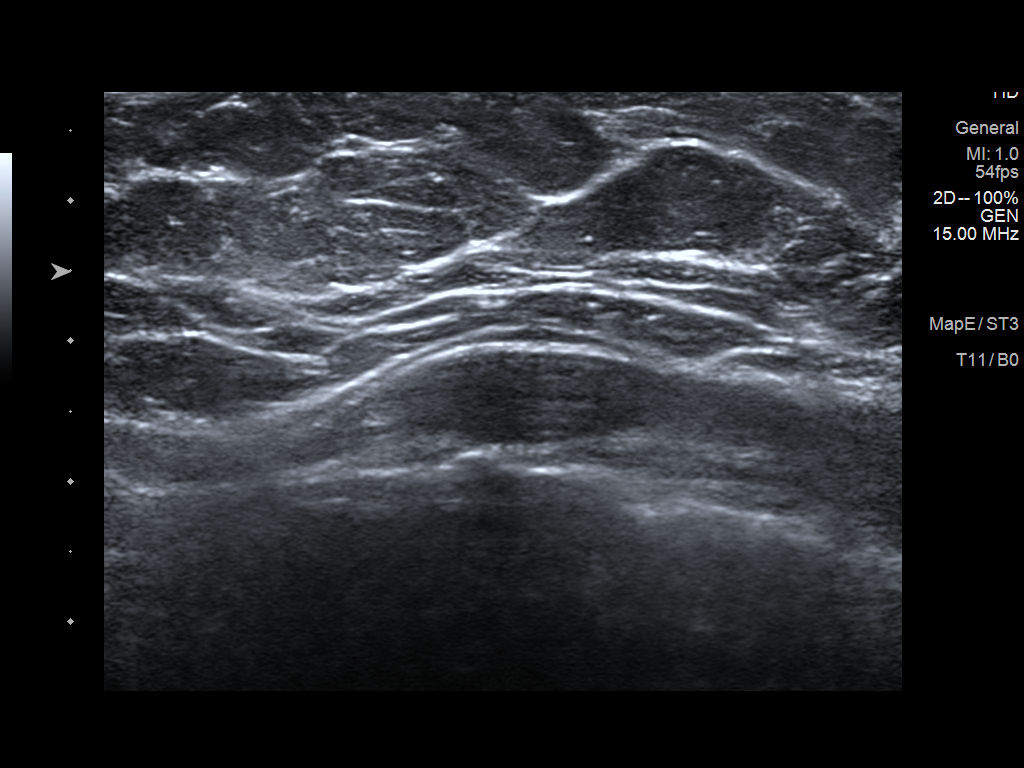
[im 5/5]
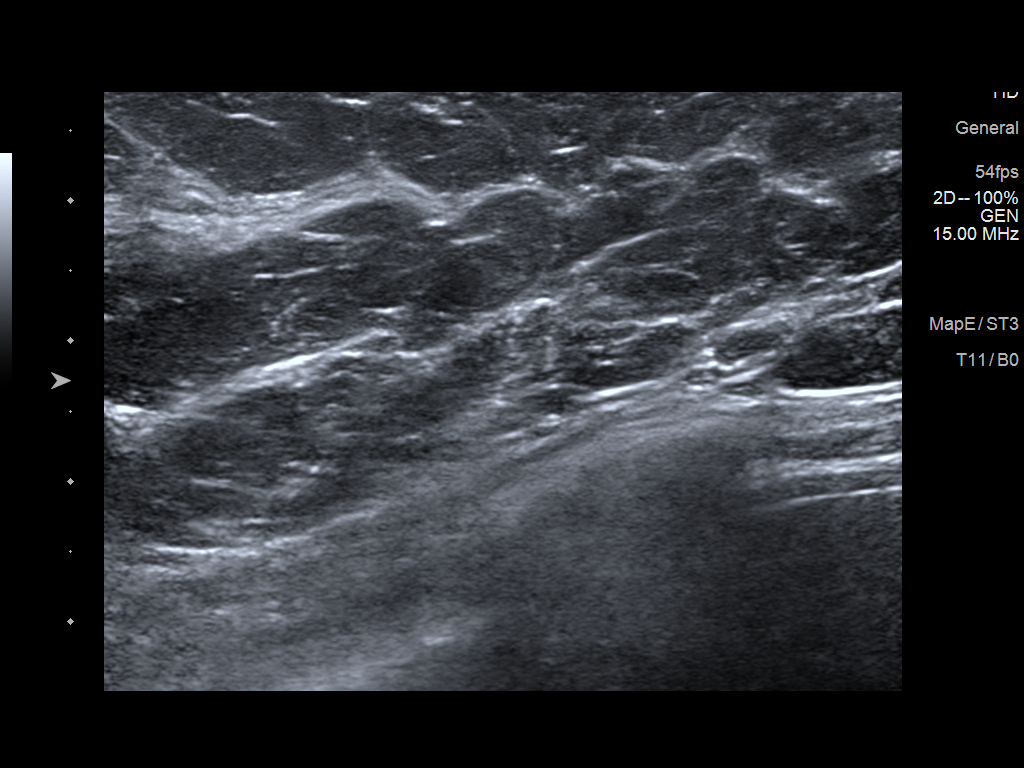

[5 of 5 positions shown; findings below may reference images not displayed]

ACR Breast Density Category c: The breast tissue is heterogeneously
dense, which may obscure small masses.
FINDINGS: No suspicious masses or calcifications are seen in either breast.
Spot compression tangential tomograms were performed over the
palpable area of concern in the lower inner right breast with only
dense fibroglandular tissue identified.

Mammographic images were processed with CAD.

Physical examination at site of palpable concern in the lower inner
right breast reveals soft thickening without a discrete underlying
palpable mass.

Targeted ultrasound of the lower inner right breast was performed.
No suspicious masses or abnormality seen, only heterogeneous
fibroglandular tissue identified.
IMPRESSION: 1.  No findings of malignancy in either breast.

2. No suspicious abnormalities identified at site of palpable
concern in the right breast.

RECOMMENDATION:
1. Recommend further management of the right breast palpable
abnormality be based on clinical assessment.

2. Screening mammogram at age 41 unless there are persistent or
intervening clinical concerns. (Code:[id number])

I have discussed the findings and recommendations with the patient.
Results were also provided in writing at the conclusion of the
visit. If applicable, a reminder letter will be sent to the patient
regarding the next appointment.

BI-RADS CATEGORY  1: Negative.
